# Patient Record
Sex: Female | Born: 1990 | Race: Black or African American | Hispanic: No | Marital: Single | Smoking: Never smoker
Health system: Southern US, Community
[De-identification: ages and names within clinical notes are randomized; demographics above are authoritative.]

## PROBLEM LIST (undated history)

## (undated) DIAGNOSIS — Z789 Other specified health status: Secondary | ICD-10-CM

---

## 2016-01-02 HISTORY — PX: CHOLECYSTECTOMY: SHX55

## 2021-01-01 NOTE — L&D Delivery Note (Addendum)
OB/GYN Faculty Practice Delivery Note  Elizabeth Terrell is a 31 y.o. [phone] s/p SVD of Twin A at [redacted]w[redacted]d. She was admitted for active labor and advanced dilation in setting of di/di Twin pregnancy.   ROM: 0h 39m with clear fluid GBS Status: unknown Maximum Maternal Temperature: 98.1  Labor Progress: Presented to MAU with contractions. Of note, patient was PPROMed at 20 weeks and membranes thought to be resealed after. Patient was noted to have cervical funneling in subsequent ultrasounds. In MAU on day of delivery patient was found to be 10 cm and was brought up to L&D.   Delivery Date/Time: 1018 on 6/15 Delivery: Initially a bulging bag with head at zero station noted and had patient push with contractions. Ultimately patient was AROMed (with clear fluid) and immediately after with two pushes head and body delivered. No nuchal cord present. Infant with spontaneous cry and stimulated. Cord clamped x 2 after 1-minute delay, and cut by Dr. Crissie Reese. Cord blood drawn. Cord gas sent. Placenta clamped and left in while attention placed on  Twin B.   Twin B with heart tones in 150s with accels present. Bulging bag of water noted. On [place] Twin B found to be in oblique position. Had patient push to attempt to facilitate fetal descent. Fetal HR remained in the 150s.  At that time no descent noted. AROM performed at that time and attempted to find foot for breech delivery. However unable to appreciate foot, cervix closing in with uterus contracting and cord palpated. Breech delivery attempted by Dr. Ephriam Jenkins briefly and then also Dr. Macon Large. Dr. Lovett Sox unable to grasp foot of infant and also palpated cord and therefore at that time code cesarean called and patient swiftly moved to OR. See separate operative note.   EBL: 200cc Analgesia: none  Infant: female  APGARs 7,8  weight pending  Warner Mccreedy, MD, MPH OB Fellow, Faculty Practice Center for [employer], [facility]

## 2021-01-15 ENCOUNTER — Encounter (HOSPITAL_COMMUNITY): Payer: Self-pay | Admitting: Emergency Medicine

## 2021-01-15 ENCOUNTER — Inpatient Hospital Stay (HOSPITAL_COMMUNITY)
Admission: EM | Admit: 2021-01-15 | Discharge: 2021-01-15 | Disposition: A | Discharge status: Home / Self Care | Payer: 59 | Attending: Obstetrics & Gynecology | Admitting: Obstetrics & Gynecology

## 2021-01-15 ENCOUNTER — Inpatient Hospital Stay (HOSPITAL_COMMUNITY): Payer: 59

## 2021-01-15 ENCOUNTER — Other Ambulatory Visit: Payer: Self-pay

## 2021-01-15 DIAGNOSIS — Z6790 Unspecified blood type, Rh positive: Secondary | ICD-10-CM | POA: Insufficient documentation

## 2021-01-15 DIAGNOSIS — O26851 Spotting complicating pregnancy, first trimester: Secondary | ICD-10-CM | POA: Diagnosis not present

## 2021-01-15 DIAGNOSIS — B9689 Other specified bacterial agents as the cause of diseases classified elsewhere: Secondary | ICD-10-CM

## 2021-01-15 DIAGNOSIS — O30041 Twin pregnancy, dichorionic/diamniotic, first trimester: Secondary | ICD-10-CM

## 2021-01-15 DIAGNOSIS — O209 Hemorrhage in early pregnancy, unspecified: Secondary | ICD-10-CM | POA: Diagnosis not present

## 2021-01-15 DIAGNOSIS — O30001 Twin pregnancy, unspecified number of placenta and unspecified number of amniotic sacs, first trimester: Secondary | ICD-10-CM

## 2021-01-15 DIAGNOSIS — Z3A01 Less than 8 weeks gestation of pregnancy: Secondary | ICD-10-CM | POA: Diagnosis not present

## 2021-01-15 DIAGNOSIS — N920 Excessive and frequent menstruation with regular cycle: Secondary | ICD-10-CM

## 2021-01-15 LAB — URINALYSIS, MICROSCOPIC (REFLEX): Bacteria, UA: NONE SEEN

## 2021-01-15 LAB — CBC
HCT: 36.1 % (ref 36.0–46.0)
Hemoglobin: 12 g/dL (ref 12.0–15.0)
MCH: 30.8 pg (ref 26.0–34.0)
MCHC: 33.2 g/dL (ref 30.0–36.0)
MCV: 92.8 fL (ref 80.0–100.0)
Platelets: 249 10*3/uL (ref 150–400)
RBC: 3.89 MIL/uL (ref 3.87–5.11)
RDW: 13.6 % (ref 11.5–15.5)
WBC: 8.6 10*3/uL (ref 4.0–10.5)
nRBC: 0 % (ref 0.0–0.2)

## 2021-01-15 LAB — URINALYSIS, ROUTINE W REFLEX MICROSCOPIC
Bilirubin Urine: NEGATIVE
Glucose, UA: NEGATIVE mg/dL
Ketones, ur: NEGATIVE mg/dL
Leukocytes,Ua: NEGATIVE
Nitrite: NEGATIVE
Protein, ur: NEGATIVE mg/dL
Specific Gravity, Urine: >1.03 — ABNORMAL HIGH (ref 1.005–1.030)
pH: 5.5 (ref 5.0–8.0)

## 2021-01-15 LAB — WET PREP, GENITAL
Sperm: NONE SEEN
Trich, Wet Prep: NONE SEEN
WBC, Wet Prep HPF POC: >=10 — AB (ref <10)
Yeast Wet Prep HPF POC: NONE SEEN

## 2021-01-15 LAB — ABO/RH: ABO/RH(D): A POS

## 2021-01-15 LAB — HCG, QUANTITATIVE, PREGNANCY: hCG, Beta Chain, Quant, S: 27981 m[IU]/mL — ABNORMAL HIGH (ref <5)

## 2021-01-15 MED ORDER — METRONIDAZOLE 500 MG PO TABS
500.0000 mg | ORAL_TABLET | Freq: Two times a day (BID) | ORAL | 0 refills | Status: DC
Start: 2021-01-15 — End: 2021-05-25

## 2021-01-15 NOTE — MAU Provider Note (Addendum)
History     CSN: [id number]  Arrival date and time: 01/15/21 1813   Event Date/Time   First Provider Initiated Contact with Patient 01/15/21 1948      Chief Complaint  Patient presents with   pregnant- spotting   Vaginal Bleeding   HPI Elizabeth Terrell is a 31 y.o. G2P1001 at [redacted]w[redacted]d who presents to MAU with chief complaint of vaginal spotting. This is a new problem "I've been doing this since I found out I was pregnant". Patient states she became concerned today when her spotting was noticably darker. She Facetimed with her sister and they decided she needed to present to MAU for evaluation. Patient denies pain, dysuria, abdominal tenderness, constipation, fever or recent illness.  OB History     Gravida  2   Para  1   Term  1   Preterm      AB      Living  1      SAB      IAB      Ectopic      Multiple      Live Births  1           History reviewed. No pertinent past medical history.  History reviewed. No pertinent surgical history.  History reviewed. No pertinent family history.  Social History   Tobacco Use   Smoking status: Never   Smokeless tobacco: Never  Substance Use Topics   Alcohol use: Not Currently   Drug use: Not Currently    Allergies: No Known Allergies  No medications prior to admission.    Review of Systems  Genitourinary:  Positive for vaginal bleeding.  All other systems reviewed and are negative. Physical Exam   Blood pressure (!) 147/85, pulse 98, temperature 98.5 F (36.9 C), temperature source Oral, resp. rate 18, height 5\' 2"  (1.575 m), weight (!) 156.4 kg, last menstrual period 12/03/2020, SpO2 97 %.  Physical Exam Vitals and nursing note reviewed. Exam conducted with a chaperone present.  Constitutional:      Appearance: Normal appearance.  Cardiovascular:     Rate and Rhythm: Normal rate and regular rhythm.     Pulses: Normal pulses.     Heart sounds: Normal heart sounds.  Pulmonary:     Effort: Pulmonary  effort is normal.     Breath sounds: Normal breath sounds.  Skin:    Capillary Refill: Capillary refill takes less than 2 seconds.  Neurological:     Mental Status: She is alert and oriented to person, place, and time.  Psychiatric:        Mood and Affect: Mood normal.        Behavior: Behavior normal.        Thought Content: Thought content normal.        Judgment: Judgment normal.    MAU Course  Procedures  Orders Placed This Encounter  Procedures   Wet prep, genital   US OB LESS THAN 14 WEEKS WITH OB TRANSVAGINAL   US OB Comp AddL Gest Less 14 Wks   CBC   hCG, quantitative, pregnancy   Urinalysis, Routine w reflex microscopic Urine, Clean Catch   Urinalysis, Microscopic (reflex)   ABO/Rh   Discharge patient Discharge disposition: 01-Home or Self Care; Discharge patient date: 01/15/2021   Patient Vitals for the past 24 hrs:  BP Temp Temp src Pulse Resp SpO2 Height Weight  01/15/21 2154 (!) 147/85 -- -- 98 -- -- -- --  01/15/21 1919 120/70 -- -- 95  18 97 % 5\' 2"  (1.575 m) (!) 156.4 kg  01/15/21 1839 139/87 98.5 F (36.9 C) Oral (!) 102 18 99 % -- --   Report given to K. Ardean Larsen, CNM who assumes care at this time  Mallie Snooks, [place], MSN, CNM Certified Nurse Midwife, [facility] for [employer], [place] 01/15/21 10:33 PM   Assessment and Plan   1. Dichorionic diamniotic twin pregnancy in first trimester   2. Vaginal bleeding affecting early pregnancy   3. Spotting in first trimester   -I have independently reviewed the [place] images, which reveal finding of Di-DI twin gestation. Ectopic pregnancy ruled out.   -patient has no active bleeding while in MAU; Rh+ blood type -reviewed [place] results with patient, explained that she will need extra monitoring and care due to twin gestation; patient expressed excitement about twin gestation -patient given RX for flagyl, reviewed that this could be cause for bleeding -recommended pelvic  rest for four weeks -patient to keep OB appt at the end of the month; bleeding precautions reviewed and when to return to MAU  -All questions answered

## 2021-01-15 NOTE — ED Provider Notes (Signed)
Emergency Medicine Provider OB Triage Evaluation Note  Elizabeth Terrell is a 31 y.o. female, G1P0, at [redacted]w[redacted]d gestation who presents to the emergency department with complaints of vaginal spotting over the past 2 weeks.  States she found out she was pregnant [redacted] week ago.  This is verified in epic.  No abdominal, pelvic, or back pain.  Review of  Systems  Positive: Vaginal bleeding Negative: Abdominal pain  Physical Exam  BP 139/87 (BP Location: Right Arm)    Pulse (!) 102    Temp 98.5 F (36.9 C) (Oral)    Resp 18    LMP 12/03/2020 (Exact Date)    SpO2 99%  General: Awake, no distress  HEENT: Atraumatic  Resp: Normal effort  Cardiac: Normal rate Abd: Nondistended, nontender  MSK: Moves all extremities without difficulty Neuro: Speech clear  Medical Decision Making  Pt evaluated for pregnancy concern and is stable for transfer to MAU. Pt is in agreement with plan for transfer.  6:49 PM Discussed with MAU APP, Sam, who accepts patient in transfer.  Clinical Impression  No diagnosis found.     Carlisle Cater, PA-C 01/15/21 [place], [place], DO 01/15/21 1926

## 2021-01-15 NOTE — ED Triage Notes (Signed)
Pt reports she is [redacted] weeks pregnant.  C/o spotting x 1 week.  Denies abd pain.  G2P0L1

## 2021-01-15 NOTE — MAU Note (Signed)
Elizabeth Terrell is a 31 y.o. at [redacted]w[redacted]d here in MAU reporting: Pt transferred from [facility] ED for vaginal spotting. No pain. LMP: 12/03/21 Onset of complaint: 1 week Pain score: 0/10 Vitals:   01/15/21 1839  BP: 139/87  Pulse: (!) 102  Resp: 18  Temp: 98.5 F (36.9 C)  SpO2: 99%     Lab orders placed from triage:

## 2021-01-16 LAB — GC/CHLAMYDIA PROBE AMP (~~LOC~~) NOT AT ARMC
Chlamydia: NEGATIVE
Comment: NEGATIVE
Comment: NORMAL
Neisseria Gonorrhea: NEGATIVE

## 2021-04-24 ENCOUNTER — Other Ambulatory Visit: Payer: Self-pay

## 2021-04-24 ENCOUNTER — Encounter (HOSPITAL_COMMUNITY): Payer: Self-pay | Admitting: Family Medicine

## 2021-04-24 ENCOUNTER — Inpatient Hospital Stay (HOSPITAL_BASED_OUTPATIENT_CLINIC_OR_DEPARTMENT_OTHER): Payer: Medicaid Other

## 2021-04-24 ENCOUNTER — Inpatient Hospital Stay (HOSPITAL_COMMUNITY)
Admission: AD | Admit: 2021-04-24 | Discharge: 2021-04-25 | DRG: 831 | Disposition: A | Discharge status: Home / Self Care | Payer: Medicaid Other | Attending: Obstetrics and Gynecology | Admitting: Obstetrics and Gynecology

## 2021-04-24 DIAGNOSIS — O10012 Pre-existing essential hypertension complicating pregnancy, second trimester: Secondary | ICD-10-CM | POA: Diagnosis present

## 2021-04-24 DIAGNOSIS — O10919 Unspecified pre-existing hypertension complicating pregnancy, unspecified trimester: Secondary | ICD-10-CM | POA: Diagnosis present

## 2021-04-24 DIAGNOSIS — O321XX1 Maternal care for breech presentation, fetus 1: Secondary | ICD-10-CM

## 2021-04-24 DIAGNOSIS — E876 Hypokalemia: Secondary | ICD-10-CM

## 2021-04-24 DIAGNOSIS — O42912 Preterm premature rupture of membranes, unspecified as to length of time between rupture and onset of labor, second trimester: Secondary | ICD-10-CM

## 2021-04-24 DIAGNOSIS — O99119 Other diseases of the blood and blood-forming organs and certain disorders involving the immune mechanism complicating pregnancy, unspecified trimester: Secondary | ICD-10-CM | POA: Diagnosis present

## 2021-04-24 DIAGNOSIS — Z3A20 20 weeks gestation of pregnancy: Secondary | ICD-10-CM

## 2021-04-24 DIAGNOSIS — O99210 Obesity complicating pregnancy, unspecified trimester: Secondary | ICD-10-CM | POA: Diagnosis present

## 2021-04-24 DIAGNOSIS — O99212 Obesity complicating pregnancy, second trimester: Secondary | ICD-10-CM | POA: Diagnosis present

## 2021-04-24 DIAGNOSIS — Z7982 Long term (current) use of aspirin: Secondary | ICD-10-CM

## 2021-04-24 DIAGNOSIS — O30042 Twin pregnancy, dichorionic/diamniotic, second trimester: Secondary | ICD-10-CM | POA: Diagnosis present

## 2021-04-24 DIAGNOSIS — E669 Obesity, unspecified: Secondary | ICD-10-CM | POA: Diagnosis not present

## 2021-04-24 DIAGNOSIS — O42919 Preterm premature rupture of membranes, unspecified as to length of time between rupture and onset of labor, unspecified trimester: Principal | ICD-10-CM

## 2021-04-24 DIAGNOSIS — R7401 Elevation of levels of liver transaminase levels: Secondary | ICD-10-CM | POA: Diagnosis present

## 2021-04-24 DIAGNOSIS — O3432 Maternal care for cervical incompetence, second trimester: Secondary | ICD-10-CM | POA: Diagnosis present

## 2021-04-24 DIAGNOSIS — Z6841 Body Mass Index (BMI) 40.0 and over, adult: Secondary | ICD-10-CM

## 2021-04-24 DIAGNOSIS — I10 Essential (primary) hypertension: Secondary | ICD-10-CM | POA: Diagnosis not present

## 2021-04-24 HISTORY — DX: Other specified health status: Z78.9

## 2021-04-24 LAB — WET PREP, GENITAL
Clue Cells Wet Prep HPF POC: NONE SEEN
Sperm: NONE SEEN
Trich, Wet Prep: NONE SEEN
WBC, Wet Prep HPF POC: >=10 — AB (ref <10)
Yeast Wet Prep HPF POC: NONE SEEN

## 2021-04-24 LAB — URINALYSIS, ROUTINE W REFLEX MICROSCOPIC
Glucose, UA: NEGATIVE mg/dL
Ketones, ur: >80 mg/dL — AB
Nitrite: POSITIVE — AB
Protein, ur: >300 mg/dL — AB
Specific Gravity, Urine: 1.025 (ref 1.005–1.030)
pH: 6.5 (ref 5.0–8.0)

## 2021-04-24 LAB — TYPE AND SCREEN
ABO/RH(D): A POS
Antibody Screen: NEGATIVE

## 2021-04-24 LAB — CBC WITH DIFFERENTIAL/PLATELET
Abs Immature Granulocytes: 0.04 10*3/uL (ref 0.00–0.07)
Basophils Absolute: 0 10*3/uL (ref 0.0–0.1)
Basophils Relative: 0 %
Eosinophils Absolute: 0 10*3/uL (ref 0.0–0.5)
Eosinophils Relative: 0 %
HCT: 32.7 % — ABNORMAL LOW (ref 36.0–46.0)
Hemoglobin: 11.1 g/dL — ABNORMAL LOW (ref 12.0–15.0)
Immature Granulocytes: 1 %
Lymphocytes Relative: 15 %
Lymphs Abs: 1 10*3/uL (ref 0.7–4.0)
MCH: 31.5 pg (ref 26.0–34.0)
MCHC: 33.9 g/dL (ref 30.0–36.0)
MCV: 92.9 fL (ref 80.0–100.0)
Monocytes Absolute: 0.4 10*3/uL (ref 0.1–1.0)
Monocytes Relative: 6 %
Neutro Abs: 5 10*3/uL (ref 1.7–7.7)
Neutrophils Relative %: 78 %
Platelets: 166 10*3/uL (ref 150–400)
RBC: 3.52 MIL/uL — ABNORMAL LOW (ref 3.87–5.11)
RDW: 13.8 % (ref 11.5–15.5)
WBC: 6.5 10*3/uL (ref 4.0–10.5)
nRBC: 0 % (ref 0.0–0.2)

## 2021-04-24 LAB — COMPREHENSIVE METABOLIC PANEL
ALT: 45 U/L — ABNORMAL HIGH (ref 0–44)
AST: 72 U/L — ABNORMAL HIGH (ref 15–41)
Albumin: 2.4 g/dL — ABNORMAL LOW (ref 3.5–5.0)
Alkaline Phosphatase: 41 U/L (ref 38–126)
Anion gap: 11 (ref 5–15)
BUN: <5 mg/dL — ABNORMAL LOW (ref 6–20)
CO2: 23 mmol/L (ref 22–32)
Calcium: 8.3 mg/dL — ABNORMAL LOW (ref 8.9–10.3)
Chloride: 101 mmol/L (ref 98–111)
Creatinine, Ser: 0.46 mg/dL (ref 0.44–1.00)
GFR, Estimated: >60 mL/min (ref >60)
Glucose, Bld: 100 mg/dL — ABNORMAL HIGH (ref 70–99)
Potassium: 2.9 mmol/L — ABNORMAL LOW (ref 3.5–5.1)
Sodium: 135 mmol/L (ref 135–145)
Total Bilirubin: 1.3 mg/dL — ABNORMAL HIGH (ref 0.3–1.2)
Total Protein: 6.7 g/dL (ref 6.5–8.1)

## 2021-04-24 LAB — URINALYSIS, MICROSCOPIC (REFLEX)

## 2021-04-24 LAB — AMNISURE RUPTURE OF MEMBRANE (ROM) NOT AT ARMC: Amnisure ROM: POSITIVE

## 2021-04-24 LAB — POCT FERN TEST: POCT Fern Test: POSITIVE

## 2021-04-24 MED ORDER — SODIUM CHLORIDE 0.9 % IV SOLN
2.0000 g | Freq: Four times a day (QID) | INTRAVENOUS | Status: DC
  Administered 2021-04-24: 2 g via INTRAVENOUS
  Filled 2021-04-24: qty 2000

## 2021-04-24 MED ORDER — INDOMETHACIN 25 MG PO CAPS
50.0000 mg | ORAL_CAPSULE | Freq: Once | ORAL | Status: AC
  Administered 2021-04-24: 50 mg via ORAL
  Filled 2021-04-24: qty 2

## 2021-04-24 MED ORDER — LACTATED RINGERS IV SOLN: INTRAVENOUS | Status: DC

## 2021-04-24 MED ORDER — ONDANSETRON 4 MG PO TBDP
8.0000 mg | ORAL_TABLET | Freq: Once | ORAL | Status: AC
  Administered 2021-04-24: 8 mg via ORAL
  Filled 2021-04-24: qty 2

## 2021-04-24 MED ORDER — DOCUSATE SODIUM 100 MG PO CAPS
100.0000 mg | ORAL_CAPSULE | Freq: Every day | ORAL | Status: DC
Start: 2021-04-24 — End: 2021-04-24

## 2021-04-24 MED ORDER — AZITHROMYCIN 250 MG PO TABS: 1000.0000 mg | ORAL_TABLET | Freq: Once | ORAL | Status: DC

## 2021-04-24 MED ORDER — ACETAMINOPHEN 325 MG PO TABS: 650.0000 mg | ORAL_TABLET | ORAL | Status: DC | PRN

## 2021-04-24 MED ORDER — SODIUM CHLORIDE 0.9 % IV SOLN: 500.0000 mg | INTRAVENOUS | Status: DC

## 2021-04-24 MED ORDER — NIFEDIPINE ER OSMOTIC RELEASE 30 MG PO TB24
30.0000 mg | ORAL_TABLET | Freq: Every day | ORAL | Status: DC
  Administered 2021-04-25: 30 mg via ORAL
  Filled 2021-04-24: qty 1

## 2021-04-24 MED ORDER — INDOMETHACIN 25 MG PO CAPS: 50.0000 mg | ORAL_CAPSULE | Freq: Once | ORAL | Status: DC

## 2021-04-24 MED ORDER — CALCIUM CARBONATE ANTACID 500 MG PO CHEW: 2.0000 | CHEWABLE_TABLET | ORAL | Status: DC | PRN

## 2021-04-24 MED ORDER — AMOXICILLIN 500 MG PO CAPS: 500.0000 mg | ORAL_CAPSULE | Freq: Three times a day (TID) | ORAL | Status: DC

## 2021-04-24 MED ORDER — POTASSIUM CHLORIDE CRYS ER 20 MEQ PO TBCR
20.0000 meq | EXTENDED_RELEASE_TABLET | Freq: Every day | ORAL | Status: DC
  Administered 2021-04-24: 20 meq via ORAL
  Filled 2021-04-24: qty 1

## 2021-04-24 MED ORDER — AZITHROMYCIN 250 MG PO TABS
1000.0000 mg | ORAL_TABLET | Freq: Once | ORAL | Status: AC
  Administered 2021-04-24: 1000 mg via ORAL
  Filled 2021-04-24: qty 4

## 2021-04-24 MED ORDER — PRENATAL MULTIVITAMIN CH
1.0000 | ORAL_TABLET | Freq: Every day | ORAL | Status: DC
  Administered 2021-04-25: 1 via ORAL
  Filled 2021-04-24: qty 1

## 2021-04-24 MED ORDER — ZOLPIDEM TARTRATE 5 MG PO TABS: 5.0000 mg | ORAL_TABLET | Freq: Every evening | ORAL | Status: DC | PRN

## 2021-04-24 NOTE — Progress Notes (Signed)
FACULTY PRACTICE ANTEPARTUM COMPREHENSIVE PROGRESS NOTE ? ?Elizabeth Terrell is a 31 y.o. G2P1001 at [redacted]w[redacted]d who is admitted for PPROM in s/o Didi twins.  Estimated Date of Delivery: 09/09/21 ?Fetal presentation is  A is breech, B is cephalic . ? ?Length of Stay:  0 Days. Admitted 04/24/2021 ? ?Subjective: ?Patient reports good fetal movement.  She reports no uterine contractions, no bleeding. She has ongoing vaginal leakage of fluid.  ? ?Vitals:  Blood pressure 137/90, pulse (!) 114, temperature 98.1 ?F (36.7 ?C), temperature source Oral, resp. rate 18, height 5\' 2"  (1.575 m), weight (!) 143.8 kg, last menstrual period 12/03/2020, SpO2 99 %. ?Physical Examination: ?CONSTITUTIONAL: Well-developed, well-nourished female in no acute distress.  ?NEUROLOGIC: Alert and oriented to person, place, and time. No cranial nerve deficit noted. ?PSYCHIATRIC: Normal mood and affect. Normal behavior. Normal judgment and thought content. ?CARDIOVASCULAR: Normal heart rate noted ?RESPIRATORY: Effort normal, no problems with respiration noted ?MUSCULOSKELETAL: Normal range of motion. No edema and no tenderness. 2+ distal pulses. ?ABDOMEN: Soft, nontender, nondistended, gravid. ?CERVIX: Dilation: 2.5 ?Exam by:: [email or web address], NP ? ?Uterine activity: no contractions ? ?Results for orders placed or performed during the hospital encounter of 04/24/21 (from the past 48 hour(s))  ?Amnisure rupture of membrane (rom)not at [facility]     Status: None  ? Collection Time: 04/24/21 11:15 AM  ?Result Value Ref Range  ? Amnisure ROM POSITIVE   ?  Comment: Performed at [facility], [address], [place], [place]  ?Wet prep, genital     Status: Abnormal  ? Collection Time: 04/24/21 11:24 AM  ? Specimen: Cervix  ?Result Value Ref Range  ? Yeast Wet Prep HPF POC NONE SEEN NONE SEEN  ? Trich, Wet Prep NONE SEEN NONE SEEN  ? Clue Cells Wet Prep HPF POC NONE SEEN NONE SEEN  ? WBC, Wet Prep HPF POC >=10 (A) <10  ? Sperm NONE SEEN   ?  Comment: Performed  at [facility], [address], [place], [place]  ?Urinalysis, Routine w reflex microscopic Urine, Clean Catch     Status: Abnormal  ? Collection Time: 04/24/21 11:37 AM  ?Result Value Ref Range  ? Color, Urine AMBER (A) YELLOW  ?  Comment: BIOCHEMICALS MAY BE AFFECTED BY COLOR  ? APPearance CLOUDY (A) CLEAR  ? Specific Gravity, Urine 1.025 1.005 - 1.030  ? pH 6.5 5.0 - 8.0  ? Glucose, UA NEGATIVE NEGATIVE mg/dL  ? Hgb urine dipstick TRACE (A) NEGATIVE  ? Bilirubin Urine MODERATE (A) NEGATIVE  ? Ketones, ur >80 (A) NEGATIVE mg/dL  ? Protein, ur >300 (A) NEGATIVE mg/dL  ? Nitrite POSITIVE (A) NEGATIVE  ? Leukocytes,Ua TRACE (A) NEGATIVE  ?  Comment: Performed at [facility], [address], [place], [place]  ?Urinalysis, Microscopic (reflex)     Status: Abnormal  ? Collection Time: 04/24/21 11:37 AM  ?Result Value Ref Range  ? RBC / HPF 6-10 0 - 5 RBC/hpf  ? WBC, UA 6-10 0 - 5 WBC/hpf  ? Bacteria, UA FEW (A) NONE SEEN  ? Squamous Epithelial / LPF 6-10 0 - 5  ? Mucus PRESENT   ?  Comment: Performed at [facility], [address], [place], [place]  ?Type and screen [facility]     Status: None  ? Collection Time: 04/24/21 11:48 AM  ?Result Value Ref Range  ? ABO/RH(D) A POS   ? Antibody Screen  NEG   ? Sample Expiration    ?  04/27/2021,2359 ?Performed at [facility], [address] [place], [place] [address] ?  ?CBC with Differential/Platelet     Status: Abnormal  ? Collection Time: 04/24/21 11:51 AM  ?Result Value Ref Range  ? WBC 6.5 4.0 - 10.5 K/uL  ? RBC 3.52 (L) 3.87 - 5.11 MIL/uL  ? Hemoglobin 11.1 (L) 12.0 - 15.0 g/dL  ? HCT 32.7 (L) 36.0 - 46.0 %  ? MCV 92.9 80.0 - 100.0 fL  ? MCH 31.5 26.0 - 34.0 pg  ? MCHC 33.9 30.0 - 36.0 g/dL  ? RDW 13.8 11.5 - 15.5 %  ? Platelets 166 150 - 400 K/uL  ? nRBC 0.0 0.0 - 0.2 %  ? Neutrophils Relative % 78 %  ? Neutro Abs 5.0 1.7 - 7.7 K/uL  ? Lymphocytes Relative 15 %  ? Lymphs Abs 1.0 0.7 - 4.0 K/uL  ?  Monocytes Relative 6 %  ? Monocytes Absolute 0.4 0.1 - 1.0 K/uL  ? Eosinophils Relative 0 %  ? Eosinophils Absolute 0.0 0.0 - 0.5 K/uL  ? Basophils Relative 0 %  ? Basophils Absolute 0.0 0.0 - 0.1 K/uL  ? Immature Granulocytes 1 %  ? Abs Immature Granulocytes 0.04 0.00 - 0.07 K/uL  ?  Comment: Performed at [facility], [address] [place], [place] [address]  ?Comprehensive metabolic panel     Status: Abnormal  ? Collection Time: 04/24/21 11:51 AM  ?Result Value Ref Range  ? Sodium 135 135 - 145 mmol/L  ? Potassium 2.9 (L) 3.5 - 5.1 mmol/L  ? Chloride 101 98 - 111 mmol/L  ? CO2 23 22 - 32 mmol/L  ? Glucose, Bld 100 (H) 70 - 99 mg/dL  ?  Comment: Glucose reference range applies only to samples taken after fasting for at least 8 hours.  ? BUN <5 (L) 6 - 20 mg/dL  ? Creatinine, Ser 0.46 0.44 - 1.00 mg/dL  ? Calcium 8.3 (L) 8.9 - 10.3 mg/dL  ? Total Protein 6.7 6.5 - 8.1 g/dL  ? Albumin 2.4 (L) 3.5 - 5.0 g/dL  ? AST 72 (H) 15 - 41 U/L  ? ALT 45 (H) 0 - 44 U/L  ? Alkaline Phosphatase 41 38 - 126 U/L  ? Total Bilirubin 1.3 (H) 0.3 - 1.2 mg/dL  ? GFR, Estimated >60 >60 mL/min  ?  Comment: (NOTE) ?Calculated using the CKD-EPI Creatinine Equation (2021) ?  ? Anion gap 11 5 - 15  ?  Comment: Performed at [facility], [address] [place], [place] [address]  ?Fern Test     Status: None  ? Collection Time: 04/24/21  1:42 PM  ?Result Value Ref Range  ? POCT Fern Test Positive = ruptured amniotic membanes   ? ? ?No results found. ? ?Current scheduled medications ? [START ON 04/25/2021] NIFEdipine  30 mg Oral Daily  ? potassium chloride  20 mEq Oral Daily  ? prenatal multivitamin  1 tablet Oral Q1200  ? ? ?I have reviewed the patient's current medications. ? ?ASSESSMENT: ?Principal Problem: ?  Preterm premature rupture of membranes (PPROM) with unknown onset of labor ?Active Problems: ?  Chronic hypertension affecting pregnancy ?  BMI 50.0-59.9, adult (HCC) ?  Obesity in pregnancy ?  Transaminitis ? ? ?PLAN: ?-  Pt had requested transfer to [facility] in hopes they would manage her care differently. I honored patient request and reviewed our care plan  with their physician, Dr. Prudencio Pair (sp?). She reported they would treat very similarly with the only difference being that sometimes they would offer latency IV ABX and home PO antibiotics once discharged.  While they would be willing to take care of the patient, they agreed that transfer by ambulance is not indicated at this time however if the patient was discharged they would be happy to see her and offer their second opinion.  ?- She and I reviewed that the best option right now for her desires is expectant management. If she remains pregnant, we would create a plan for when she would come in to delivery and receive BMZ and other measures to aid in outcomes. We reviewed that with PPROM, typical time to delivery is one week, but there are many instances where women remain pregnant until viability. We discussed it will be important to also talk to the NICU to be prepared for potential risks of low AFI (her AFI is normal for both), and risks of prematurity.  ?- We discussed that the medications are not effective for the length of the pregnancy and hence why we advise doing them closer to viability. We discussed latency antibiotics prior to 23 weeks has not shown to be effective and is thus not part of the recommendations although some providers may offer them (as noted above).  ?- She will need a GBS swab tomorrow ?- All questions answered and the patient would like to stay overnight.  ? ?Continue routine antenatal care. ? ? ?Milas Hock, MD, FACOG ?Obstetrician Heritage manager, Faculty Practice ?Center for [employer], [facility] ? ?

## 2021-04-24 NOTE — MAU Note (Signed)
Elizabeth Terrell is a 31 y.o. at [redacted]w[redacted]d here in MAU reporting: LOF yellow fluid that began Thursday and period like cramping.  Denies VB. ? ?Onset of complaint: cramping 2 weeks /LOF Thursday ?Pain score: 9/10 with ambulation ?Vitals:  ? 04/24/21 1032  ?BP: 110/62  ?Pulse: (!) 127  ?Resp: 20  ?Temp: 98.7 ?F (37.1 ?C)  ?SpO2: 96%  ?   ? ?Lab orders placed from triage:    ?

## 2021-04-24 NOTE — MAU Provider Note (Addendum)
?History  ?  ? ?CSN: [id number] ? ?Arrival date and time: 04/24/21 0956 ? ? Event Date/Time  ? First Provider Initiated Contact with Patient 04/24/21 1059   ?  ? ?Chief Complaint  ?Patient presents with  ? Rupture of Membranes  ? ?HPI ? ?Ms.Marland KitchenAilah Terrell is a 31 y.o. female G87P1001 @ [redacted]w[redacted]d with Di/Di twins here In MAU with complaints of leaking of fluid. She has been leaking fluid since Thursday. The fluid is yellow in color. She reports fluid is leaking down her leg. She has no itching or burning. She reports lower abdominal cramping that comes and goes. The pain feels like period cramps. She has not taken anything for the pain. She has no bleeding.  ? ?She started her care at [facility], on 4/17 she was told her cervix was 1 cm dilated, she was offered cerclage vs progesterone. She chose progesterone which she has been placing nightly. She recently transferred her care to [facility] and is scheduled for her first appointment.  ? ?Hx of fatty liver disease (negative hepatitis), no history of diabetes.  ? ?OB History   ? ? Gravida  ?2  ? Para  ?1  ? Term  ?1  ? Preterm  ?   ? AB  ?   ? Living  ?1  ?  ? ? SAB  ?   ? IAB  ?   ? Ectopic  ?   ? Multiple  ?   ? Live Births  ?1  ?   ?  ?  ? ? ?Past Medical History:  ?Diagnosis Date  ? Medical history non-contributory   ? ? ?Past Surgical History:  ?Procedure Laterality Date  ? CHOLECYSTECTOMY  2018  ? ? ?Family History  ?Problem Relation Age of Onset  ? Glaucoma Mother   ? Heart disease Father   ? Hypertension Father   ? ? ?Social History  ? ?Tobacco Use  ? Smoking status: Never  ? Smokeless tobacco: Never  ?Vaping Use  ? Vaping Use: Never used  ?Substance Use Topics  ? Alcohol use: Not Currently  ?  Comment: Occas  ? Drug use: Not Currently  ? ? ?Allergies: No Known Allergies ? ?Medications Prior to Admission  ?Medication Sig Dispense Refill Last Dose  ? aspirin 81 MG chewable tablet Chew 81 mg by mouth daily.   04/23/2021  ? NIFEdipine (PROCARDIA-XL/NIFEDICAL-XL) 30 MG 24  hr tablet Take 30 mg by mouth daily.   04/23/2021  ? Prenatal Vit-Fe Fumarate-FA (MULTIVITAMIN-PRENATAL) 27-0.8 MG TABS tablet Take 1 tablet by mouth daily at 12 noon.   04/23/2021  ? progesterone (PROMETRIUM) 200 MG capsule Take 200 mg by mouth daily.   04/23/2021  ? metroNIDAZOLE (FLAGYL) 500 MG tablet Take 1 tablet (500 mg total) by mouth 2 (two) times daily. 14 tablet 0   ? ?Results for orders placed or performed during the hospital encounter of 04/24/21 (from the past 48 hour(s))  ?Amnisure rupture of membrane (rom)not at [facility]     Status: None  ? Collection Time: 04/24/21 11:15 AM  ?Result Value Ref Range  ? Amnisure ROM POSITIVE   ?  Comment: Performed at [facility], [address] [place], [place] [address]  ?Wet prep, genital     Status: Abnormal  ? Collection Time: 04/24/21 11:24 AM  ? Specimen: Cervix  ?Result Value Ref Range  ? Yeast Wet Prep HPF POC NONE SEEN NONE SEEN  ? Trich, Wet Prep NONE SEEN NONE SEEN  ?  Clue Cells Wet Prep HPF POC NONE SEEN NONE SEEN  ? WBC, Wet Prep HPF POC >=10 (A) <10  ? Sperm NONE SEEN   ?  Comment: Performed at [facility], [address] [place], [place] [address]  ?Urinalysis, Routine w reflex microscopic Urine, Clean Catch     Status: Abnormal  ? Collection Time: 04/24/21 11:37 AM  ?Result Value Ref Range  ? Color, Urine AMBER (A) YELLOW  ?  Comment: BIOCHEMICALS MAY BE AFFECTED BY COLOR  ? APPearance CLOUDY (A) CLEAR  ? Specific Gravity, Urine 1.025 1.005 - 1.030  ? pH 6.5 5.0 - 8.0  ? Glucose, UA NEGATIVE NEGATIVE mg/dL  ? Hgb urine dipstick TRACE (A) NEGATIVE  ? Bilirubin Urine MODERATE (A) NEGATIVE  ? Ketones, ur >80 (A) NEGATIVE mg/dL  ? Protein, ur >300 (A) NEGATIVE mg/dL  ? Nitrite POSITIVE (A) NEGATIVE  ? Leukocytes,Ua TRACE (A) NEGATIVE  ?  Comment: Performed at [facility], [address] [place], [place] [address]  ?Urinalysis, Microscopic (reflex)     Status: Abnormal  ? Collection Time: 04/24/21 11:37 AM  ?Result Value Ref Range  ? RBC /  HPF 6-10 0 - 5 RBC/hpf  ? WBC, UA 6-10 0 - 5 WBC/hpf  ? Bacteria, UA FEW (A) NONE SEEN  ? Squamous Epithelial / LPF 6-10 0 - 5  ? Mucus PRESENT   ?  Comment: Performed at [facility], [address], [place], [place] [address]  ?CBC with Differential/Platelet     Status: Abnormal  ? Collection Time: 04/24/21 11:51 AM  ?Result Value Ref Range  ? WBC 6.5 4.0 - 10.5 K/uL  ? RBC 3.52 (L) 3.87 - 5.11 MIL/uL  ? Hemoglobin 11.1 (L) 12.0 - 15.0 g/dL  ? HCT 32.7 (L) 36.0 - 46.0 %  ? MCV 92.9 80.0 - 100.0 fL  ? MCH 31.5 26.0 - 34.0 pg  ? MCHC 33.9 30.0 - 36.0 g/dL  ? RDW 13.8 11.5 - 15.5 %  ? Platelets 166 150 - 400 K/uL  ? nRBC 0.0 0.0 - 0.2 %  ? Neutrophils Relative % 78 %  ? Neutro Abs 5.0 1.7 - 7.7 K/uL  ? Lymphocytes Relative 15 %  ? Lymphs Abs 1.0 0.7 - 4.0 K/uL  ? Monocytes Relative 6 %  ? Monocytes Absolute 0.4 0.1 - 1.0 K/uL  ? Eosinophils Relative 0 %  ? Eosinophils Absolute 0.0 0.0 - 0.5 K/uL  ? Basophils Relative 0 %  ? Basophils Absolute 0.0 0.0 - 0.1 K/uL  ? Immature Granulocytes 1 %  ? Abs Immature Granulocytes 0.04 0.00 - 0.07 K/uL  ?  Comment: Performed at [facility], [address], [place], [place] [address]  ?Comprehensive metabolic panel     Status: Abnormal  ? Collection Time: 04/24/21 11:51 AM  ?Result Value Ref Range  ? Sodium 135 135 - 145 mmol/L  ? Potassium 2.9 (L) 3.5 - 5.1 mmol/L  ? Chloride 101 98 - 111 mmol/L  ? CO2 23 22 - 32 mmol/L  ? Glucose, Bld 100 (H) 70 - 99 mg/dL  ?  Comment: Glucose reference range applies only to samples taken after fasting for at least 8 hours.  ? BUN <5 (L) 6 - 20 mg/dL  ? Creatinine, Ser 0.46 0.44 - 1.00 mg/dL  ? Calcium 8.3 (L) 8.9 - 10.3 mg/dL  ? Total Protein 6.7 6.5 - 8.1 g/dL  ? Albumin 2.4 (L) 3.5 - 5.0  g/dL  ? AST 72 (H) 15 - 41 U/L  ? ALT 45 (H) 0 - 44 U/L  ? Alkaline Phosphatase 41 38 - 126 U/L  ? Total Bilirubin 1.3 (H) 0.3 - 1.2 mg/dL  ? GFR, Estimated >60 >60 mL/min  ?  Comment: (NOTE) ?Calculated using the CKD-EPI Creatinine Equation  (2021) ?  ? Anion gap 11 5 - 15  ?  Comment: Performed at [facility], [address] [place], [place] [address]  ?  ?Review of Systems  ?Constitutional:  Negative for fever.  ?Gastrointestinal:  Positive for abdominal pain.  ?Genitourinary:  Positive for vaginal discharge. Negative for vaginal bleeding.  ?Physical Exam  ? ?Blood pressure 130/76, pulse (!) 106, temperature 98.5 ?F (36.9 ?C), temperature source Oral, resp. rate 18, weight (!) 144 kg, last menstrual period 12/03/2020, SpO2 97 %. ? ?Physical Exam ?Vitals and nursing note reviewed.  ?Constitutional:   ?   General: She is not in acute distress. ?   Appearance: She is obese. She is not ill-appearing or diaphoretic.  ?HENT:  ?   Head: Normocephalic.  ?Eyes:  ?   Pupils: Pupils are equal, round, and reactive to light.  ?Genitourinary: ?   Comments: Vagina - moderate to large amount of yellow/green particulate fluid.  ?Cervix -fluid activiely leaking from cervix. 2 white, 2 mm white patches noted on cervix.  ?Bimanual exam: 2.5 cm, 50 %, -3 ?GC/Chlam, wet prep done ?Chaperone present for exam.   ?Exam by Venia CarbonJennifer Tattiana Fakhouri, NP  ?Musculoskeletal:  ?   Cervical back: Normal range of motion.  ?Skin: ?   General: Skin is warm.  ?Neurological:  ?   Mental Status: She is oriented to person, place, and time.  ? ?MAU Course  ?Procedures ?None ? ?MDM ? ?+ fetal heart tones ?Amnisure positive, fern positive ?US shows normal fluid levels ?GC/ wet prep collected  ?CBC and CMP ?Discussed patient with Dr. Adrian BlackwaterStinson who recommends antibiotics to be started now.  ?1 dose of indocin given for pain.  ?Spoke to Dr. Vergie LivingPickens through RN in the OR; observation recommended at this time.  ?Indocin and amoxicillin vomited 30 minutes after given.  ?Zofran 8 mg ODT. Will attempt following Zofran.  ? ?Assessment and Plan  ? ?A: ? ?1. Preterm premature rupture of membranes (PPROM) with unknown onset of labor   ?2. [redacted] weeks gestation of pregnancy   ?3. Dichorionic diamniotic twin  pregnancy in second trimester   ?4. Chronic hypokalemia   ?  ? ?P: ? ?Admit to Ante ?Neonatology consult ?Reviewed the plan of care in detail with the patient.  ?Kdur ? ? ? ?Duane Lopeasch, Soliyana Mcchristian I, NP ?04/24/2021 ?1:5

## 2021-04-24 NOTE — H&P (Signed)
Obstetrics Admission History & Physical  ?04/24/2021 - 6:29 PM ?Primary OBGYN: [facility] ? ?Chief Complaint: PPROM today  ?History of Present Illness  ?31 y.o. G2P1001 @ [redacted]w[redacted]d (LMP=5wk u/s), with the above CC. Pregnancy complicated by: di-di twins, cHTN, BMI 50s, h/o pre-eclampsia, elevated ast/alt ? ?[name] states that she's had vaginal discharge for the past week and cramping. Evaluation in OB triage showed PPROM on fern and on speculum exam, which showed "moderate to large amount of yellow/green particulate fluid" with actively LOF from the cervix and the cervix was 2-3/50/-3 on digital check.  ? ?Patient admitted for observation.  ? ?On 4/17, patient had anatomy u/s that showed "dynamic short cx" at 10.49mm with U shape, and pt noted to be asymptomatic. She had a repeat spec exam that same day that was stable at 1cm and no visible membranes. It appears that cerclage vs vag progesterone offered and she elected for the latter with repeat u/s. This was done on 4/21, which showed u shaped funnel similar to prior scan and approx 15.81mm in length. It appears that they again d/w her re: both options and she elected to continue with vaginal progesterone, with plan to repeat u/s in a week. ? ? ?Review of Systems: as noted in the History of Present Illness. ? ?Patient Active Problem List  ? Diagnosis Date Noted  ? Preterm premature rupture of membranes (PPROM) with unknown onset of labor 04/24/2021  ? Chronic hypertension affecting pregnancy 04/24/2021  ? BMI 50.0-59.9, adult (HCC) 04/24/2021  ? Obesity in pregnancy 04/24/2021  ? Transaminitis 04/24/2021  ? Twin gestation in first trimester 01/15/2021  ? Bacterial vaginosis 01/15/2021  ? Spotting 01/15/2021  ?  ? ?PMHx:  ?Past Medical History:  ?Diagnosis Date  ? Medical history non-contributory   ? ?PSHx:  ?Past Surgical History:  ?Procedure Laterality Date  ? CHOLECYSTECTOMY  2018  ? ?Medications:  ?Medications Prior to Admission  ?Medication Sig Dispense  Refill Last Dose  ? aspirin 81 MG chewable tablet Chew 81 mg by mouth daily.   04/23/2021  ? NIFEdipine (PROCARDIA-XL/NIFEDICAL-XL) 30 MG 24 hr tablet Take 30 mg by mouth daily.   04/23/2021  ? Prenatal Vit-Fe Fumarate-FA (MULTIVITAMIN-PRENATAL) 27-0.8 MG TABS tablet Take 1 tablet by mouth daily at 12 noon.   04/23/2021  ? progesterone (PROMETRIUM) 200 MG capsule Take 200 mg by mouth daily.   04/23/2021  ? metroNIDAZOLE (FLAGYL) 500 MG tablet Take 1 tablet (500 mg total) by mouth 2 (two) times daily. 14 tablet 0   ? ? ? ?Allergies: has No Known Allergies. ?OBHx:  ?OB History  ?Gravida Para Term Preterm AB Living  ?2 1 1     1   ?SAB IAB Ectopic Multiple Live Births  ?        1  ?  ?# Outcome Date GA Lbr Len/2nd Weight Sex Delivery Anes PTL Lv  ?2 Current           ?1 Term 2018     Vag-Spont   LIV  ?      ?FHx:  ?Family History  ?Problem Relation Age of Onset  ? Glaucoma Mother   ? Heart disease Father   ? Hypertension Father   ? ?Soc Hx:  ?Social History  ? ?Socioeconomic History  ? Marital status: Single  ?  Spouse name: Not on file  ? Number of children: Not on file  ? Years of education: Not on file  ? Highest education level: Not on file  ?  Occupational History  ? Not on file  ?Tobacco Use  ? Smoking status: Never  ? Smokeless tobacco: Never  ?Vaping Use  ? Vaping Use: Never used  ?Substance and Sexual Activity  ? Alcohol use: Not Currently  ?  Comment: Occas  ? Drug use: Not Currently  ? Sexual activity: Yes  ?Other Topics Concern  ? Not on file  ?Social History Narrative  ? Not on file  ? ?Social Determinants of Health  ? ?Financial Resource Strain: Not on file  ?Food Insecurity: Not on file  ?Transportation Needs: Not on file  ?Physical Activity: Not on file  ?Stress: Not on file  ?Social Connections: Not on file  ?Intimate Partner Violence: Not on file  ? ? ?Objective  ? ? Current Vital Signs 24h Vital Sign Ranges  ?T 97.8 ?F (36.6 ?C) Temp  Avg: 98.3 ?F (36.8 ?C)  Min: 97.8 ?F (36.6 ?C)  Max: 98.7 ?F (37.1 ?C)   ?BP 137/83 BP  Min: 107/65  Max: 152/77  ?HR (!) 103 ? Pulse  Avg: 113.7  Min: 103  Max: 127  ?RR 17 Resp  Avg: 18.3  Min: 17  Max: 20  ?SaO2 98 % Room Air SpO2  Avg: 96.6 %  Min: 96 %  Max: 98 %  ?    ? 24 Hour I/O Current Shift I/O  ?Time ?Ins ?Outs No intake/output data recorded. 04/24 0701 - 04/24 1900 ?In: 557.9 [P.O.:30; I.V.:422.9] ?Out: -   ? ?Toco: quiet ? ?General: Well nourished, well developed female in no acute distress.  ?Skin:  Warm and dry.  ?Respiratory:   Normal respiratory effort ?Abdomen: obese, nttp ?Neuro/Psych:  Normal mood and affect.  ? ? ?Labs  ?As per HPI ?Pending: GC/CT ?Wet prep: negative ?Recent Labs  ?Lab 04/24/21 ?1151  ?WBC 6.5  ?HGB 11.1*  ?HCT 32.7*  ?PLT 166  ? ? ?Recent Labs  ?Lab 04/24/21 ?1151  ?NA 135  ?K 2.9*  ?CL 101  ?CO2 23  ?BUN <5*  ?CREATININE 0.46  ?CALCIUM 8.3*  ?PROT 6.7  ?BILITOT 1.3*  ?ALKPHOS 41  ?ALT 45*  ?AST 72*  ?GLUCOSE 100*  ? ? ?Radiology ?4/24 prelim ?A breech, normal AFI and FHR ?B cephalic, normal AFI and FHR ? ?4/17 ?A: 251gm, 15% ?B: 256gm, 19% ? ?Assessment & Plan  ? 31 y.o. G2P1001 @ [redacted]w[redacted]d with PPROM diagnosed today; pt stable ?Long d/w patient re: plan of care. When patient was initially admitted from [facility] triage, the admitting provider placed her on indocin and latency abx; she as never given antenatal steroids. I discontinued the tocolytic and abx.  ? ?I told her that given how pre-viable she is abx, steroids and tocolysis is not indicated, and she is upset at this. I told her that, at this point, our focus is on maternal well being b/c of how pre-viable the babies are. I told her that I recommend observation overnight and if stable, then okay to discharge to home tomorrow and weekly checks and admission at 23-24wks for steroids, abx, and possibly Mg. She feels that I don't care about the well being of her babies and that [facility] offered her a cerclage which she states that she would like. I told her that when they saw her last week her water was  not broken, and I told her that if it was, they would not have offered her a cerclage. She states that her water was broken last week b/c she has been having discharge since  then, but I, again, told her that their evaluation last week did not indicated ROM, and that, most likely, they would not have offered her a cerclage if they thought she had ROM.  ?I told her that I do and it is a difficult situation but b/c of their extreme pre-maturity the above interventions at this time are not indicated. I told her that observing her overnight is a pre-cautionary measure that I do for PPROM mid 2nd trimester and if she wanted to NICU tonight and MFM in the morning then I can arrange that. I also said that if she desired the opinion of her primary OBs at [facility] then she is stable for d/c tonight to have them evaluate her tonight.  ?Patient stays that she will stay for now but declines NICU consult overnight or MFM consult tomorrow morning b/c she said she might leave ? ?Leave on toco overnight. Bid FHTs. SCDs, OOB ad lib. Regular diet. SLIV ? ?Elizabeth Terrell, Jr. MD ?Attending ?[facility](Faculty Practice) ? ?

## 2021-04-25 ENCOUNTER — Inpatient Hospital Stay (HOSPITAL_BASED_OUTPATIENT_CLINIC_OR_DEPARTMENT_OTHER): Payer: Medicaid Other

## 2021-04-25 DIAGNOSIS — O30042 Twin pregnancy, dichorionic/diamniotic, second trimester: Secondary | ICD-10-CM

## 2021-04-25 DIAGNOSIS — O42912 Preterm premature rupture of membranes, unspecified as to length of time between rupture and onset of labor, second trimester: Secondary | ICD-10-CM | POA: Diagnosis not present

## 2021-04-25 DIAGNOSIS — O99119 Other diseases of the blood and blood-forming organs and certain disorders involving the immune mechanism complicating pregnancy, unspecified trimester: Secondary | ICD-10-CM | POA: Diagnosis present

## 2021-04-25 DIAGNOSIS — O10912 Unspecified pre-existing hypertension complicating pregnancy, second trimester: Secondary | ICD-10-CM

## 2021-04-25 DIAGNOSIS — O321XX1 Maternal care for breech presentation, fetus 1: Secondary | ICD-10-CM

## 2021-04-25 DIAGNOSIS — D696 Thrombocytopenia, unspecified: Secondary | ICD-10-CM | POA: Diagnosis present

## 2021-04-25 DIAGNOSIS — O99212 Obesity complicating pregnancy, second trimester: Secondary | ICD-10-CM

## 2021-04-25 DIAGNOSIS — E669 Obesity, unspecified: Secondary | ICD-10-CM

## 2021-04-25 DIAGNOSIS — E876 Hypokalemia: Secondary | ICD-10-CM | POA: Diagnosis present

## 2021-04-25 DIAGNOSIS — Z3A20 20 weeks gestation of pregnancy: Secondary | ICD-10-CM

## 2021-04-25 DIAGNOSIS — O3432 Maternal care for cervical incompetence, second trimester: Secondary | ICD-10-CM

## 2021-04-25 DIAGNOSIS — O42919 Preterm premature rupture of membranes, unspecified as to length of time between rupture and onset of labor, unspecified trimester: Secondary | ICD-10-CM | POA: Diagnosis not present

## 2021-04-25 DIAGNOSIS — O10012 Pre-existing essential hypertension complicating pregnancy, second trimester: Secondary | ICD-10-CM

## 2021-04-25 DIAGNOSIS — I10 Essential (primary) hypertension: Secondary | ICD-10-CM

## 2021-04-25 LAB — COMPREHENSIVE METABOLIC PANEL
ALT: 39 U/L (ref 0–44)
AST: 56 U/L — ABNORMAL HIGH (ref 15–41)
Albumin: 2.1 g/dL — ABNORMAL LOW (ref 3.5–5.0)
Alkaline Phosphatase: 34 U/L — ABNORMAL LOW (ref 38–126)
Anion gap: 8 (ref 5–15)
BUN: <5 mg/dL — ABNORMAL LOW (ref 6–20)
CO2: 22 mmol/L (ref 22–32)
Calcium: 8.2 mg/dL — ABNORMAL LOW (ref 8.9–10.3)
Chloride: 104 mmol/L (ref 98–111)
Creatinine, Ser: 0.43 mg/dL — ABNORMAL LOW (ref 0.44–1.00)
GFR, Estimated: >60 mL/min (ref >60)
Glucose, Bld: 94 mg/dL (ref 70–99)
Potassium: 2.8 mmol/L — ABNORMAL LOW (ref 3.5–5.1)
Sodium: 134 mmol/L — ABNORMAL LOW (ref 135–145)
Total Bilirubin: 0.9 mg/dL (ref 0.3–1.2)
Total Protein: 6 g/dL — ABNORMAL LOW (ref 6.5–8.1)

## 2021-04-25 LAB — HEPATITIS PANEL, ACUTE
HCV Ab: NONREACTIVE
Hep A IgM: NONREACTIVE
Hep B C IgM: NONREACTIVE
Hepatitis B Surface Ag: NONREACTIVE

## 2021-04-25 LAB — CBC
HCT: 29.8 % — ABNORMAL LOW (ref 36.0–46.0)
Hemoglobin: 10.1 g/dL — ABNORMAL LOW (ref 12.0–15.0)
MCH: 31.2 pg (ref 26.0–34.0)
MCHC: 33.9 g/dL (ref 30.0–36.0)
MCV: 92 fL (ref 80.0–100.0)
Platelets: 149 10*3/uL — ABNORMAL LOW (ref 150–400)
RBC: 3.24 MIL/uL — ABNORMAL LOW (ref 3.87–5.11)
RDW: 13.8 % (ref 11.5–15.5)
WBC: 5.1 10*3/uL (ref 4.0–10.5)
nRBC: 0 % (ref 0.0–0.2)

## 2021-04-25 LAB — GC/CHLAMYDIA PROBE AMP (~~LOC~~) NOT AT ARMC
Chlamydia: NEGATIVE
Comment: NEGATIVE
Comment: NORMAL
Neisseria Gonorrhea: NEGATIVE

## 2021-04-25 MED ORDER — POTASSIUM CHLORIDE CRYS ER 20 MEQ PO TBCR
30.0000 meq | EXTENDED_RELEASE_TABLET | Freq: Two times a day (BID) | ORAL | Status: DC
  Administered 2021-04-25: 30 meq via ORAL
  Filled 2021-04-25: qty 2

## 2021-04-25 MED ORDER — POTASSIUM CHLORIDE CRYS ER 20 MEQ PO TBCR
20.0000 meq | EXTENDED_RELEASE_TABLET | Freq: Two times a day (BID) | ORAL | 0 refills | Status: DC
Start: 2021-04-25 — End: 2021-05-19

## 2021-04-25 NOTE — Progress Notes (Signed)
CSW acknowledged consult for transportation needs and met with patient at bedside. CSW introduced self and explained consult. Patient was laying down in bed. CSW inquired about patient's transportation needs. Patient reported that she had initially needed a ride for her appointment on Friday but feels that she no longer needs to go due to being admitted and her water breaking. CSW asked if patient had issues with getting to her prenatal appointments previously, patient shared that she had not signed up for transportation services before. Patient shared that she attempted to sign her son up for [facility] Medicaid transportation in the past but was told that he was not eligible due to him having a different insurance (Medicaid). CSW informed patient that she could call the local [facility] to set up transportation for her son. Patient shared frustrations about contacting [employer], noting that the wait times are Benedicto Capozzi. CSW acknowledged and normalized patient's frustrations when it comes to contacting [employer] as it can take a Vertis Bauder time. Patient spoke about how it's stressful. CSW acknowledged how it can possibly provoke stress and encouraged patient to follow up when she feels that she able to. CSW informed patient that she can utilize [facility] transportation for her transportation as she has that insurance and offered the contact information. Patient reported that she already has the number. CSW asked if patient would have a ride home when ready for discharge, patient reported that she was uncertain because her boyfriend may be at work. CSW encouraged patient to notify CSW and or RN if she needs assistance arranging transportation home. CSW inquired about any additional needs, patient reported that she applied for utility assistance through [facility] and had not heard anything back yet. Patient also shared that she feels her food stamps should be increased as she is not working as much. Patient reported that she  contacted [facility] and had not heard anything back. CSW acknowledged patient's frustrations and encouraged patient to contact [employer] when she can to inquire about her resources and time frame. Patient verbalized understanding and denied any additional needs at this time. CSW asked patient was interested in a [facility] referral for care coordination, patient declined and reported that she has a care coordinator Licensed conveyancer) that works for [employer]. CSW provided patient with CSW contact information and encouraged patient to contact CSW if any needs arise.  ? ?Abundio Miu, LCSW ?Clinical Social Worker ?[facility] ?Cell#: [phone] ? ?

## 2021-04-25 NOTE — Progress Notes (Addendum)
Daily Antepartum Note  ?Admission Date: 04/24/2021 ?Current Date: 04/25/2021 ?9:08 AM ? ?Elizabeth Terrell is a 31 y.o. G2P1001 @ [redacted]w[redacted]d by LMP=5wk u/s, HD#2, admitted for PPROM at 20/2. ? ?Pregnancy complicated by: ?Patient Active Problem List  ? Diagnosis Date Noted  ? Preterm premature rupture of membranes (PPROM) with unknown onset of labor 04/24/2021  ? Chronic hypertension affecting pregnancy 04/24/2021  ? BMI 50.0-59.9, adult ([place]) 04/24/2021  ? Obesity in pregnancy 04/24/2021  ? Transaminitis 04/24/2021  ? Twin gestation in first trimester 01/15/2021  ? Bacterial vaginosis 01/15/2021  ? Spotting 01/15/2021  ? ? ?Overnight/24hr events:  ?none ? ?Subjective:  ?+LOF (clear-yellow). No VB, spotting or pain  ? ?Objective:  ? ? Current Vital Signs 24h Vital Sign Ranges  ?T 98.5 ?F (36.9 ?C) Temp  Avg: 98.2 ?F (36.8 ?C)  Min: 97.8 ?F (36.6 ?C)  Max: 98.7 ?F (37.1 ?C)  ?BP 127/69 BP  Min: 102/59  Max: 152/77  ?HR (!) 115 ? Pulse  Avg: 112.8  Min: 101  Max: 127  ?RR 18 Resp  Avg: 18  Min: 17  Max: 20  ?SaO2  (96) Room Air SpO2  Avg: 97.4 %  Min: 96 %  Max: 99 %  ?    ? 24 Hour I/O Current Shift I/O  ?Time ?Ins ?Outs 04/24 0701 - 04/25 0700 ?In: 557.9 [P.O.:30; I.V.:422.9] ?Out: -  No intake/output data recorded.  ? ?Fetal Heart Tones: 169//147 ?Tocometry: negative ? ?Physical exam: ?General: Well nourished, well developed female in no acute distress. ?Abdomen: obese, nttp ?Respiratory: no respiratory distress ?Extremities: no clubbing, cyanosis or edema ?Skin: Warm and dry.  ? ?Medications: ?Current Facility-Administered Medications  ?Medication Dose Route Frequency Provider Last Rate Last Admin  ? acetaminophen (TYLENOL) tablet 650 mg  650 mg Oral Q4H PRN Wende Mott, CNM      ? calcium carbonate (TUMS - dosed in mg elemental calcium) chewable tablet 400 mg of elemental calcium  2 tablet Oral Q4H PRN Wende Mott, CNM      ? lactated ringers infusion   Intravenous Continuous Rasch, Anderson Malta I, NP 125 mL/hr at  04/25/21 0037 New Bag at 04/25/21 0037  ? NIFEdipine (PROCARDIA-XL/NIFEDICAL-XL) 24 hr tablet 30 mg  30 mg Oral Daily Aletha Halim, MD      ? potassium chloride SA (KLOR-CON M) CR tablet 20 mEq  20 mEq Oral Daily Rasch, Anderson Malta I, NP   20 mEq at 04/24/21 1616  ? prenatal multivitamin tablet 1 tablet  1 tablet Oral Q1200 Len Blalock M, CNM      ? zolpidem (AMBIEN) tablet 5 mg  5 mg Oral QHS PRN Wende Mott, CNM      ? ? ?Labs:  ?Recent Labs  ?Lab 04/24/21 ?1151 04/25/21 ?0441  ?WBC 6.5 5.1  ?HGB 11.1* 10.1*  ?HCT 32.7* 29.8*  ?PLT 166 149*  ? ? ?Recent Labs  ?Lab 04/24/21 ?1151 04/25/21 ?0441  ?NA 135 134*  ?K 2.9* 2.8*  ?CL 101 104  ?CO2 23 22  ?BUN <5* <5*  ?CREATININE 0.46 0.43*  ?CALCIUM 8.3* 8.2*  ?PROT 6.7 6.0*  ?BILITOT 1.3* 0.9  ?ALKPHOS 41 34*  ?ALT 45* 39  ?AST 72* 56*  ?GLUCOSE 100* 94  ? ?Pending: acute hep panel ?Radiology:  ?No new imaging ? ?Assessment & Plan:  ?Pt stable ?*Pregnancy:maternal fetal status stable ?*PPROM: no e/o infection, PTL, VB. Pt would like MFM and NICU consult this morning and they were called. Continue to hold off  on interventions given pre-viable ?*Preterm: see above ?*Heme: follow plts ?*PPx: SCDs, OOB ad lib ?*FEN/GI: regular diet, SLIV, continue kdur. Likely fatty liver. Cmp better, f/u pending acute hep panel ?*Dispo: today if stays stable and after consults ? ?Durene Romans MD ?Attending ?Center for [employer] Fish farm manager) ?GYN Consult Phone: [phone] (M-F, [phone]) & [phone]  (Off hours, weekends, holidays) ? ?

## 2021-04-25 NOTE — Discharge Summary (Signed)
Physician Discharge Summary  ?Patient ID: ?Elizabeth Terrell ?MRN: [id number] ?DOB/AGE: 1990/01/27 30 y.o. ? ?Admit date: 04/24/2021 ?Discharge date: 04/25/2021 ? ?Admission Diagnoses: Pregnancy at 20/2 weeks by LMP=5 week ultrasound. PPROM at 20/2. Cervical insufficiency diagnosed 1 week prior. Worsened cervical dilation. Dichorionic-diamniotic twins. ? ?Secondary diagnoses: BMI 50s. Chronic hypertension. History of pre-eclampsia. Elevated AST/ALT ? ?Discharge Diagnoses:  ?Pregnancy at 20/3 weeks.  ? ?Discharged Condition: good ? ?Hospital Course: Patient presented to MAU for LOF x 1 week and was diagnosed with PPROM. Her OB provider is [facility] and they diagnosed her at her anatomy u/s with cervical insufficiency on 4/17 with 12mm cervix and u shaped funneling on u/s and 1cm dilation on speculum exam. Pt opted for vaginal progesterone and had stable repeat u/s on 4/21 with [facility].  ? ?In MAU, she was noted to be 2-3cm dilated; pt denies any VB or pain during admission or her hospital stay. She was observed overnight to ensure she didn't go into preterm labor,which she did not. D/w pt re: no current interventions indicated but she was upset and requested transfer to [facility], due to her transportation difficulties, but they were called but they declined transfer.  ? ?She was seen by MFM and NICU and d/w her re: only plan of care, given pre-viability, would be to do expectant management as an outpatient until 22-23wks. At [facility], we discussed with her re: outpatient steroids at 22/6 and admission at 23/0 ? ?Discharge Exam: ?Blood pressure 122/61, pulse (!) 105, temperature 97.7 ?F (36.5 ?C), temperature source Oral, resp. rate 19, height 5\' 2"  (1.575 m), weight (!) 143.8 kg, last menstrual period 12/03/2020, SpO2 99 %. ?General appearance: alert ?Resp: clear to auscultation bilaterally ?Cardio: regular rate and rhythm, S1, S2 normal, no murmur, click, rub or gallop ?GI: soft, non-tender; bowel sounds normal; no masses,  no  organomegaly ?Extremities: extremities normal, atraumatic, no cyanosis or edema ?Skin: Skin color, texture, turgor normal. No rashes or lesions ?Neurologic: Grossly normal ? ?Disposition:  ?Discharge disposition: 01-Home or Self Care ? ? ? ? ? ? ?Discharge Instructions   ? ? Discharge patient   Complete by: As directed ?  ? Discharge disposition: 01-Home or Self Care  ? Discharge patient date: 04/25/2021  ? ?  ? ?Allergies as of 04/25/2021   ?No Known Allergies ?  ? ?  ?Medication List  ?  ? ?STOP taking these medications   ? ?progesterone 200 MG capsule ?Commonly known as: PROMETRIUM ?  ? ?  ? ?TAKE these medications   ? ?aspirin 81 MG chewable tablet ?Chew 81 mg by mouth daily. ?  ?metroNIDAZOLE 500 MG tablet ?Commonly known as: FLAGYL ?Take 1 tablet (500 mg total) by mouth 2 (two) times daily. ?  ?multivitamin-prenatal 27-0.8 MG Tabs tablet ?Take 1 tablet by mouth daily at 12 noon. ?  ?NIFEdipine 30 MG 24 hr tablet ?Commonly known as: PROCARDIA-XL/NIFEDICAL-XL ?Take 30 mg by mouth daily. ?  ?potassium chloride SA 20 MEQ tablet ?Commonly known as: KLOR-CON M ?Take 1 tablet (20 mEq total) by mouth 2 (two) times daily for 8 doses. ?  ? ?  ? ? Follow-up Information   ? ? [facility]. Go in 3 day(s).   ?Specialty: Maternal and Fetal Medicine ?Why: for a repeat ultrasound ?Contact information: ?[address], [address] ?[place] ?[phone] ? ?  ?  ? ?  ?  ? ?  ? ? ?Signed: ?[phone] ?04/25/2021, 3:11 PM ? ? ?

## 2021-04-25 NOTE — Consult Note (Signed)
MFM Note ? ?Elizabeth Terrell is a 31 year old gravida 2 para 1 currently at 20 weeks and 3 days with a spontaneously conceived dichorionic, diamniotic twin gestation.  She was admitted yesterday due to leakage of fluid.   ? ?Rupture of membranes was confirmed as there was active fluid leaking from her cervix, as she had a positive AmniSure test, and as there was ferning noted.  Her cervix was noted to be 2 cm dilated and 50% on admission yesterday. ? ?The patient was noted to have a shortened cervix noted during her fetal anatomy scan performed with MFM at [facility] last week.  She was offered a cerclage versus daily vaginal progesterone.  The patient opted to use the daily vaginal progesterone. ? ?The patient reports that she started to leak fluid about 5 days ago.  She is leaking less fluid today.  She denies any signs or symptoms of an intrauterine infection. ? ?Her past obstetrical history includes one prior uncomplicated full-term vaginal delivery. ? ?Her current pregnancy has also been complicated by maternal obesity and chronic hypertension treated with Procardia 30 mg daily. ? ?A limited ultrasound performed today shows normal amniotic fluid around both twin A and twin B.   ? ?Twin A has a maximal vertical pocket of amniotic fluid of 6 cm.  Twin B has a maximal vertical pocket of 5.7 cm. ? ?The poor prognosis associated with PPROM at her current gestational age was discussed with the patient.  She was advised that due to rupture of membranes along with cervical dilatation, there is a high likelihood that she will go into spontaneous labor and deliver both fetuses in the near future.   ? ?She understands that there is a low likelihood of survival should she deliver prior to viability, which is currently considered at around 23 weeks.   ? ?The increased risk of fetal pulmonary hypoplasia and fetal contractures associated with early rupture of membranes was discussed.  As the amniotic fluid  levels are still within normal limits, the chances of these complications occurring are low at this time.   ? ?The patient understands that she will most likely continue to lose amniotic fluid unless the membranes reseal. ? ?As the fetus' are not considered viable at her current gestational age, she may be discharged home later today. ? ?I will schedule another ultrasound for fetal assessment at the end of this week.   ? ?We will continue to follow her with weekly ultrasounds for fetal assessment until about 23 weeks at which time she should be readmitted for continued inpatient management until delivery. ? ?We will plan on administering a complete course of antenatal corticosteroids starting at 22 weeks and 5 days. ? ?As she has ruptured membranes, pelvic rest was advised.  She was also advised to discontinue the daily vaginal progesterone as the vaginal progesterone may have limited benefits in someone who has ruptured membranes. ? ?She was advised to return to the hospital should she experience frequent contractions, should she show any signs or symptoms of an intrauterine infection such as fever or shaking chills, or for decreased fetal movements. ? ?I will have someone from our office contact the patient to schedule her future ultrasound exams in our office.   ? ?The patient stated that all of her questions have been answered today and is comfortable and agreeable with the management plan discussed above. ? ?

## 2021-04-25 NOTE — Progress Notes (Signed)
Discharge instructions and prescriptions given to pt. Discussed signs and symptoms to report to the MD, upcoming appointments, and meds. Pt verbalizes understanding and has no questions or concerns at this time. Pt discharged home from hospital in stable condition. 

## 2021-04-25 NOTE — Consult Note (Signed)
Neonatal-Perinatal Medicine Consult Note ? ? ?Jamaika Brasseaux ?[id number] ? ?I was asked by Dr. Vergie Living to consult on this patient for possible periviable preterm delivery.  I had the pleasure of meeting with Ms. Noxon today. The babies' father was also present via phone.   She is a 31 y.o. G2P1001 at 20+3 weeks with di/di twins who presented to MAU yesterday with PROM on 4/20.  They are expecting 1 boy and 1 girl.  ? ?Counseling ?We discussed that at their current gestational age of [redacted] weeks, the twins are considered pre-viable, meaning they would not survive despite maximum crticial care and for that reason, NICU intervention is not offered.  We then discussed what it would mean if Ms. Erle made it to 22-[redacted] weeks gestation, which is considered peri-viable.  We discussed the poor overall survival rate at this gestational age, and the even lower survival rate without significant neurologic impairment.  We discussed the expected hospital length of stay and some of the significant problems associated with extreme prematurity including respiratory distress syndrome/CLD, IVH/PVL, ROP, NEC, and infection risk. I explained that should they choose critical care interventions, the infant would be intubated immediately in the delivery room.  They understands that despite maximum critical care interventions, their babies may encounter complications that he/she may not be able to survive.  They also understand that if the babies do survive, he/she may have life-long medical complexities such as blindness, cerebral palsy, and significant developmental delay. ?  ?Given the above information, I explained that there is joint medical decision making for infant's born 22-[redacted] weeks gestation and that there are three options should she delivery prior to 25 weeks ?  ?1. Provide maximum critical care intervention, utilizing every measure possible to save their baby's life.   ?2. Do not initiate critical care, and instead provide  comfort care until the baby passes, allowing parents to hold if they desire.   ?3. Initiate intensive care and see how her baby responds, being open to the idea of with-drawling care should significant complications arise.  ? ?I explained that there is no right or wrong decision here, that each family has their own set of values and definitions of quality of life which make this a very personal choice. ?  ?I also explained that due to the even higher risk of morbidity/mortality at [redacted] weeks gestation, this institution does not currently offer resuscitation at 22 weeks.  However, there are other hospitals that would attempt intervention at her current gestation age should that be the parent's wishes.   ?  ?Plan ?Ms. Mccalvin and her partner want any and all interventions that may be offered to their children should she stay pregnant until [redacted] weeks gestation.  Should it seem that she may deliver at [redacted] weeks gestation, Ms. Hamacher should be instructed to go to/offered transfer to a local quatern[facility] care teaching hospital that provides intervention at this gestation age, assuming she is medically stable for such a transfer.  [facility], and [facility] all offer resuscitation at 22 weeks.  If she does not threaten delivery prior to [redacted] weeks gestation, we will be available to offer intervention here in this hospital. Please consider betamethasone for her starting 48 hours prior to their decided gestational age that they would like to initiate intensive care measures for the baby.  ?  ?Thank you for involving [place] in the care of this patient. A member of our team will be available should the family have additional questions.  Time for consultation was approximately >40 minutes, of which more than half was face-to-face.  ? ?_____________________ ?Towana Badger, MD ?Neonatal Medicine ?04/25/2021             12:55 PM ? ?

## 2021-04-26 ENCOUNTER — Telehealth: Payer: 59

## 2021-04-26 ENCOUNTER — Other Ambulatory Visit: Payer: Self-pay | Admitting: *Deleted

## 2021-04-26 DIAGNOSIS — O42919 Preterm premature rupture of membranes, unspecified as to length of time between rupture and onset of labor, unspecified trimester: Secondary | ICD-10-CM

## 2021-04-26 DIAGNOSIS — O30042 Twin pregnancy, dichorionic/diamniotic, second trimester: Secondary | ICD-10-CM

## 2021-04-28 ENCOUNTER — Ambulatory Visit (HOSPITAL_BASED_OUTPATIENT_CLINIC_OR_DEPARTMENT_OTHER): Payer: Medicaid Other

## 2021-04-28 ENCOUNTER — Ambulatory Visit: Payer: Medicaid Other | Attending: Obstetrics

## 2021-04-28 ENCOUNTER — Other Ambulatory Visit: Payer: Self-pay | Admitting: *Deleted

## 2021-04-28 ENCOUNTER — Ambulatory Visit: Payer: Medicaid Other | Admitting: *Deleted

## 2021-04-28 ENCOUNTER — Encounter: Payer: Self-pay | Admitting: *Deleted

## 2021-04-28 VITALS — BP 135/90 | HR 102

## 2021-04-28 DIAGNOSIS — O42919 Preterm premature rupture of membranes, unspecified as to length of time between rupture and onset of labor, unspecified trimester: Secondary | ICD-10-CM

## 2021-04-28 DIAGNOSIS — O10912 Unspecified pre-existing hypertension complicating pregnancy, second trimester: Secondary | ICD-10-CM

## 2021-04-28 DIAGNOSIS — O3432 Maternal care for cervical incompetence, second trimester: Secondary | ICD-10-CM

## 2021-04-28 DIAGNOSIS — O30042 Twin pregnancy, dichorionic/diamniotic, second trimester: Secondary | ICD-10-CM | POA: Diagnosis not present

## 2021-04-28 DIAGNOSIS — O10012 Pre-existing essential hypertension complicating pregnancy, second trimester: Secondary | ICD-10-CM | POA: Diagnosis not present

## 2021-04-28 DIAGNOSIS — O99212 Obesity complicating pregnancy, second trimester: Secondary | ICD-10-CM

## 2021-04-28 DIAGNOSIS — O42912 Preterm premature rupture of membranes, unspecified as to length of time between rupture and onset of labor, second trimester: Secondary | ICD-10-CM

## 2021-04-28 DIAGNOSIS — Z369 Encounter for antenatal screening, unspecified: Secondary | ICD-10-CM

## 2021-04-28 DIAGNOSIS — Z3A20 20 weeks gestation of pregnancy: Secondary | ICD-10-CM

## 2021-05-05 ENCOUNTER — Encounter: Payer: Self-pay | Admitting: *Deleted

## 2021-05-05 ENCOUNTER — Other Ambulatory Visit: Payer: Self-pay | Admitting: Obstetrics

## 2021-05-05 ENCOUNTER — Ambulatory Visit: Payer: Medicaid Other | Admitting: *Deleted

## 2021-05-05 ENCOUNTER — Ambulatory Visit: Payer: Medicaid Other | Attending: Obstetrics

## 2021-05-05 VITALS — BP 142/56 | HR 95

## 2021-05-05 DIAGNOSIS — O30042 Twin pregnancy, dichorionic/diamniotic, second trimester: Secondary | ICD-10-CM | POA: Diagnosis not present

## 2021-05-05 DIAGNOSIS — O10912 Unspecified pre-existing hypertension complicating pregnancy, second trimester: Secondary | ICD-10-CM | POA: Insufficient documentation

## 2021-05-05 DIAGNOSIS — O10012 Pre-existing essential hypertension complicating pregnancy, second trimester: Secondary | ICD-10-CM

## 2021-05-05 DIAGNOSIS — O3432 Maternal care for cervical incompetence, second trimester: Secondary | ICD-10-CM

## 2021-05-05 DIAGNOSIS — O42912 Preterm premature rupture of membranes, unspecified as to length of time between rupture and onset of labor, second trimester: Secondary | ICD-10-CM | POA: Diagnosis not present

## 2021-05-05 DIAGNOSIS — Z3A21 21 weeks gestation of pregnancy: Secondary | ICD-10-CM

## 2021-05-05 DIAGNOSIS — Z362 Encounter for other antenatal screening follow-up: Secondary | ICD-10-CM

## 2021-05-11 ENCOUNTER — Ambulatory Visit (INDEPENDENT_AMBULATORY_CARE_PROVIDER_SITE_OTHER): Payer: Medicaid Other | Admitting: Family Medicine

## 2021-05-11 ENCOUNTER — Ambulatory Visit (HOSPITAL_BASED_OUTPATIENT_CLINIC_OR_DEPARTMENT_OTHER): Payer: Medicaid Other | Admitting: Obstetrics

## 2021-05-11 ENCOUNTER — Other Ambulatory Visit: Payer: Self-pay | Admitting: *Deleted

## 2021-05-11 ENCOUNTER — Encounter: Payer: Self-pay | Admitting: Family Medicine

## 2021-05-11 ENCOUNTER — Ambulatory Visit: Payer: Medicaid Other | Admitting: *Deleted

## 2021-05-11 ENCOUNTER — Ambulatory Visit: Payer: Medicaid Other | Attending: Obstetrics

## 2021-05-11 VITALS — BP 126/85 | HR 91 | Wt 322.0 lb

## 2021-05-11 VITALS — BP 138/89 | HR 96

## 2021-05-11 DIAGNOSIS — O3432 Maternal care for cervical incompetence, second trimester: Secondary | ICD-10-CM

## 2021-05-11 DIAGNOSIS — O30042 Twin pregnancy, dichorionic/diamniotic, second trimester: Secondary | ICD-10-CM

## 2021-05-11 DIAGNOSIS — O10919 Unspecified pre-existing hypertension complicating pregnancy, unspecified trimester: Secondary | ICD-10-CM

## 2021-05-11 DIAGNOSIS — O0990 Supervision of high risk pregnancy, unspecified, unspecified trimester: Secondary | ICD-10-CM | POA: Diagnosis not present

## 2021-05-11 DIAGNOSIS — O99212 Obesity complicating pregnancy, second trimester: Secondary | ICD-10-CM | POA: Diagnosis present

## 2021-05-11 DIAGNOSIS — O42919 Preterm premature rupture of membranes, unspecified as to length of time between rupture and onset of labor, unspecified trimester: Secondary | ICD-10-CM | POA: Diagnosis not present

## 2021-05-11 DIAGNOSIS — O10012 Pre-existing essential hypertension complicating pregnancy, second trimester: Secondary | ICD-10-CM | POA: Diagnosis not present

## 2021-05-11 DIAGNOSIS — O30049 Twin pregnancy, dichorionic/diamniotic, unspecified trimester: Secondary | ICD-10-CM

## 2021-05-11 DIAGNOSIS — O10912 Unspecified pre-existing hypertension complicating pregnancy, second trimester: Secondary | ICD-10-CM

## 2021-05-11 DIAGNOSIS — O42112 Preterm premature rupture of membranes, onset of labor more than 24 hours following rupture, second trimester: Secondary | ICD-10-CM | POA: Diagnosis present

## 2021-05-11 DIAGNOSIS — Z3A22 22 weeks gestation of pregnancy: Secondary | ICD-10-CM

## 2021-05-11 DIAGNOSIS — E669 Obesity, unspecified: Secondary | ICD-10-CM

## 2021-05-11 DIAGNOSIS — O4292 Full-term premature rupture of membranes, unspecified as to length of time between rupture and onset of labor: Secondary | ICD-10-CM | POA: Diagnosis not present

## 2021-05-11 DIAGNOSIS — O99210 Obesity complicating pregnancy, unspecified trimester: Secondary | ICD-10-CM

## 2021-05-11 DIAGNOSIS — R7401 Elevation of levels of liver transaminase levels: Secondary | ICD-10-CM

## 2021-05-11 DIAGNOSIS — O99119 Other diseases of the blood and blood-forming organs and certain disorders involving the immune mechanism complicating pregnancy, unspecified trimester: Secondary | ICD-10-CM

## 2021-05-11 DIAGNOSIS — E876 Hypokalemia: Secondary | ICD-10-CM

## 2021-05-11 DIAGNOSIS — D696 Thrombocytopenia, unspecified: Secondary | ICD-10-CM

## 2021-05-11 DIAGNOSIS — G479 Sleep disorder, unspecified: Secondary | ICD-10-CM

## 2021-05-11 MED ORDER — UNISOM SLEEPTABS 25 MG PO TABS: 25.0000 mg | ORAL_TABLET | Freq: Every evening | ORAL | 1 refills | Status: DC | PRN

## 2021-05-11 NOTE — Progress Notes (Signed)
? ?PRENATAL VISIT NOTE ? ?Subjective:  ?Elizabeth Terrell is a 31 y.o. G2P1001 at 2w5dbeing seen today for ongoing prenatal care.  [facility]  She is currently monitored for the following issues for this high-risk pregnancy and has Twin gestation in first trimester; Bacterial vaginosis; Spotting; Preterm premature rupture of membranes (PPROM) with unknown onset of labor; Chronic hypertension affecting pregnancy; BMI 50.0-59.9, adult ([place]; Obesity in pregnancy; Transaminitis; Hypokalemia; and Gestational thrombocytopenia ([place] on their problem list. ? ?Patient reports no complaints.  Contractions: Not present. Vag. Bleeding: None.  Movement: Present. Denies leaking of fluid.  ? ?The following portions of the patient's history were reviewed and updated as appropriate: allergies, current medications, past family history, past medical history, past social history, past surgical history and problem list.  ? ?Objective:  ? ?Vitals:  ? 05/11/21 1558  ?BP: 126/85  ?Pulse: 91  ?Weight: (!) 322 lb (146.1 kg)  ? ?Fetal Status: Normal FHTs at MFM appt today on [place] Movement: Present. ? ?General:  Alert, oriented and cooperative. Patient is in no acute distress.  ?Skin: Skin is warm and dry. No rash noted.   ?Cardiovascular: Normal heart rate noted.  ?Respiratory: Normal respiratory effort, no problems with respiration noted.  ?Abdomen: Soft, gravid, appropriate for gestational age.       ?Pelvic: Cervical exam deferred.  ?Extremities: Normal range of motion.    ?Mental Status: Normal mood and affect. Normal behavior. Normal judgment and thought content.  ? ?Assessment and Plan:  ?Pregnancy: G2P1001 at 250w5d ?1. Supervision of high risk pregnancy, antepartum ?2. [redacted] weeks gestation of pregnancy ?3. Dichorionic diamniotic twin pregnancy, antepartum ?4. Preterm premature rupture of membranes (PPROM) with unknown onset of labor ?5. Cervical  shortening ?Progressing well.  No longer leaking any fluid.  Evaluated by MFM this morning with Dr. FaAnnamaria Boots Normal amniotic fluid around both babies.  Given that membrane's may have resealed, patient deemed stable for further outpatient management per MFM instead of admission as previously planned.  Patient will have follow up [place] cervical length next week given hx of cervical shortening this pregnancy. If funneling is present, plan to administer complete course of steroids. PTL precautions reviewed with patient, she voiced understanding. [facility] ? ?6. Obesity in pregnancy ? ?7. Chronic hypertension affecting pregnancy ?BP within normal limits on Procardia 30 mg. Taking ASA 81 mg as well. Will continue current regimen for now.  ? ?8. Transaminitis ?Previously elevated in the setting of HEG and gallstones s/p cholecystectomy. LFTs have improved overall since prior. Nausea and vomiting recently stopped per patient. Will repeat today and follow up results.   ?- Comp Met (CMET) ? ?9. Hypokalemia ?Last K low at 2.8 in the setting of N/V and HEG as noted above. Will repeat today and replete as needed.  ?- Comp Met (CMET) ? ?10. Thrombocytopenia affecting pregnancy ([facility] [place]?Last platelets 149. Will repeat CBC today to reassess trend.  ?- CBC ? ?11. Trouble in sleeping ?Will trial Unisom and reassess at next visit.  ?- doxylamine, Sleep, (UNISOM SLEEPTABS) 25 MG tablet; Take 1 tablet (25 mg total) by mouth at bedtime as needed.  Dispense: 30 tablet; Refill: 1 ? ?Preterm labor symptoms and general obstetric precautions including but not limited to vaginal bleeding, contractions, leaking of fluid and fetal movement were reviewed in detail with the patient. ? ?Please refer to  After Visit Summary for other counseling recommendations.  ? ?Return in about 4 weeks (around 06/08/2021) for follow up [facility] OB visit. ? ?Future Appointments  ?Date Time Provider Bray  ?05/18/2021  10:00 AM [facility] NURSE [facility]  ?05/19/2021 10:00 AM [facility] NURSE [facility]  ?06/08/2021 10:30 AM [facility] NURSE [facility]  ?06/08/2021 10:45 AM [facility] US4 [facility]  ?06/12/2021 11:15 AM Luvenia Redden, PA-C [facility]  ? ?Genia Del, MD ? ?

## 2021-05-11 NOTE — Progress Notes (Signed)
MFM Note ? ?Elizabeth Terrell was seen for a limited ultrasound due to a dichorionic, diamniotic twin gestation where rupture of membranes was diagnosed few weeks ago.  The patient reports that she has not leaked any amniotic fluid for over a week.  The amniotic fluid levels around both fetuses have appeared within normal limits since she was diagnosed with rupture of membranes.  She denies any contractions and denies any signs of an intrauterine infection.   ? ?On today's exam, there continues to be normal amniotic fluid noted around both fetuses.   ? ?Due to PPROM, the plan had been to administer a complete course of antenatal corticosteroids at 22 weeks and 5 days with the plan for hospitalization and continued inpatient management until delivery starting at 23 weeks. ? ?However as the patient's membranes may have resealed, as she is no longer leaking fluid and as the amniotic fluid levels around both fetuses have always been within normal limits, she may not require hospitalization and inpatient management. ? ?As cervical shortening was noted earlier in her pregnancy, I checked her cervix today and found that her cervix is closed. ? ?We will continue to follow her as an outpatient with weekly ultrasound exams for fetal assessment.   ? ?She will return in 1 week for a growth ultrasound and transvaginal ultrasound to assess her cervical length.   ? ?Should cervical funneling be noted next week (indicating that she may be at risk for a preterm birth), we will plan on administering a complete course of antenatal corticosteroids at that time. ? ?The patient understands that she should go to the hospital should she feel any further leakage of fluid, should she have any signs or symptoms of an intrauterine infection, or should she feel contractions.  ? ?The patient is happy and comfortable with this management plan.   ? ?The patient stated that all of her questions were answered. ? ?A total of 20 minutes was spent  counseling and coordinating the care for this patient.  Greater than 50% of the time was spent in direct face-to-face contact. ?

## 2021-05-12 ENCOUNTER — Encounter: Payer: Self-pay | Admitting: Family Medicine

## 2021-05-12 LAB — CBC
Hematocrit: 31.8 % — ABNORMAL LOW (ref 34.0–46.6)
Hemoglobin: 10.5 g/dL — ABNORMAL LOW (ref 11.1–15.9)
MCH: 30.7 pg (ref 26.6–33.0)
MCHC: 33 g/dL (ref 31.5–35.7)
MCV: 93 fL (ref 79–97)
Platelets: 239 10*3/uL (ref 150–450)
RBC: 3.42 x10E6/uL — ABNORMAL LOW (ref 3.77–5.28)
RDW: 13.7 % (ref 11.7–15.4)
WBC: 8.9 10*3/uL (ref 3.4–10.8)

## 2021-05-12 LAB — COMPREHENSIVE METABOLIC PANEL
ALT: 47 IU/L — ABNORMAL HIGH (ref 0–32)
AST: 48 IU/L — ABNORMAL HIGH (ref 0–40)
Albumin/Globulin Ratio: 1 — ABNORMAL LOW (ref 1.2–2.2)
Albumin: 3.6 g/dL — ABNORMAL LOW (ref 3.8–4.8)
Alkaline Phosphatase: 47 IU/L (ref 44–121)
BUN/Creatinine Ratio: 10 (ref 9–23)
BUN: 4 mg/dL — ABNORMAL LOW (ref 6–20)
Bilirubin Total: 0.6 mg/dL (ref 0.0–1.2)
CO2: 19 mmol/L — ABNORMAL LOW (ref 20–29)
Calcium: 8.9 mg/dL (ref 8.7–10.2)
Chloride: 99 mmol/L (ref 96–106)
Creatinine, Ser: 0.4 mg/dL — ABNORMAL LOW (ref 0.57–1.00)
Globulin, Total: 3.6 g/dL (ref 1.5–4.5)
Glucose: 79 mg/dL (ref 70–99)
Potassium: 3.5 mmol/L (ref 3.5–5.2)
Sodium: 135 mmol/L (ref 134–144)
Total Protein: 7.2 g/dL (ref 6.0–8.5)
eGFR: 135 mL/min/{1.73_m2} (ref >59)

## 2021-05-18 ENCOUNTER — Ambulatory Visit: Payer: Medicaid Other | Attending: Obstetrics

## 2021-05-18 ENCOUNTER — Ambulatory Visit: Payer: Medicaid Other

## 2021-05-18 DIAGNOSIS — O10912 Unspecified pre-existing hypertension complicating pregnancy, second trimester: Secondary | ICD-10-CM | POA: Diagnosis not present

## 2021-05-18 DIAGNOSIS — O42912 Preterm premature rupture of membranes, unspecified as to length of time between rupture and onset of labor, second trimester: Secondary | ICD-10-CM | POA: Insufficient documentation

## 2021-05-18 DIAGNOSIS — O30042 Twin pregnancy, dichorionic/diamniotic, second trimester: Secondary | ICD-10-CM | POA: Diagnosis present

## 2021-05-18 DIAGNOSIS — Z3A23 23 weeks gestation of pregnancy: Secondary | ICD-10-CM | POA: Diagnosis not present

## 2021-05-18 DIAGNOSIS — O99212 Obesity complicating pregnancy, second trimester: Secondary | ICD-10-CM | POA: Diagnosis not present

## 2021-05-18 DIAGNOSIS — O3432 Maternal care for cervical incompetence, second trimester: Secondary | ICD-10-CM | POA: Diagnosis not present

## 2021-05-18 MED ORDER — BETAMETHASONE SOD PHOS & ACET 6 (3-3) MG/ML IJ SUSP
12.0000 mg | Freq: Once | INTRAMUSCULAR | Status: AC
  Administered 2021-05-18: 12 mg via INTRAMUSCULAR

## 2021-05-19 ENCOUNTER — Ambulatory Visit: Payer: Medicaid Other

## 2021-05-19 ENCOUNTER — Ambulatory Visit: Payer: Medicaid Other | Admitting: *Deleted

## 2021-05-19 ENCOUNTER — Ambulatory Visit (HOSPITAL_BASED_OUTPATIENT_CLINIC_OR_DEPARTMENT_OTHER): Payer: Medicaid Other

## 2021-05-19 VITALS — BP 135/80 | HR 120

## 2021-05-19 DIAGNOSIS — O99212 Obesity complicating pregnancy, second trimester: Secondary | ICD-10-CM

## 2021-05-19 DIAGNOSIS — O30042 Twin pregnancy, dichorionic/diamniotic, second trimester: Secondary | ICD-10-CM

## 2021-05-19 DIAGNOSIS — O10012 Pre-existing essential hypertension complicating pregnancy, second trimester: Secondary | ICD-10-CM | POA: Diagnosis not present

## 2021-05-19 DIAGNOSIS — E669 Obesity, unspecified: Secondary | ICD-10-CM

## 2021-05-19 DIAGNOSIS — Z3A23 23 weeks gestation of pregnancy: Secondary | ICD-10-CM

## 2021-05-19 DIAGNOSIS — O3432 Maternal care for cervical incompetence, second trimester: Secondary | ICD-10-CM | POA: Diagnosis not present

## 2021-05-19 DIAGNOSIS — O42912 Preterm premature rupture of membranes, unspecified as to length of time between rupture and onset of labor, second trimester: Secondary | ICD-10-CM | POA: Diagnosis not present

## 2021-05-19 DIAGNOSIS — O42919 Preterm premature rupture of membranes, unspecified as to length of time between rupture and onset of labor, unspecified trimester: Secondary | ICD-10-CM

## 2021-05-19 MED ORDER — BETAMETHASONE SOD PHOS & ACET 6 (3-3) MG/ML IJ SUSP
12.0000 mg | Freq: Once | INTRAMUSCULAR | Status: AC
Start: 2021-05-19 — End: 2021-05-19
  Administered 2021-05-19: 12 mg via INTRAMUSCULAR

## 2021-05-25 ENCOUNTER — Other Ambulatory Visit: Payer: Self-pay

## 2021-05-25 ENCOUNTER — Ambulatory Visit: Payer: Medicaid Other | Attending: Obstetrics | Admitting: Obstetrics

## 2021-05-25 ENCOUNTER — Encounter (HOSPITAL_COMMUNITY): Payer: Self-pay | Admitting: Obstetrics

## 2021-05-25 ENCOUNTER — Inpatient Hospital Stay (HOSPITAL_COMMUNITY)
Admission: AD | Admit: 2021-05-25 | Discharge: 2021-05-25 | Payer: Medicaid Other | Attending: Obstetrics | Admitting: Obstetrics

## 2021-05-25 ENCOUNTER — Ambulatory Visit: Payer: Medicaid Other | Admitting: *Deleted

## 2021-05-25 ENCOUNTER — Other Ambulatory Visit: Payer: Self-pay | Admitting: Obstetrics

## 2021-05-25 ENCOUNTER — Ambulatory Visit: Payer: Medicaid Other | Attending: Obstetrics

## 2021-05-25 VITALS — BP 116/75 | HR 101

## 2021-05-25 DIAGNOSIS — O10912 Unspecified pre-existing hypertension complicating pregnancy, second trimester: Secondary | ICD-10-CM | POA: Insufficient documentation

## 2021-05-25 DIAGNOSIS — O30042 Twin pregnancy, dichorionic/diamniotic, second trimester: Secondary | ICD-10-CM | POA: Insufficient documentation

## 2021-05-25 DIAGNOSIS — E669 Obesity, unspecified: Secondary | ICD-10-CM

## 2021-05-25 DIAGNOSIS — O99212 Obesity complicating pregnancy, second trimester: Secondary | ICD-10-CM

## 2021-05-25 DIAGNOSIS — K760 Fatty (change of) liver, not elsewhere classified: Secondary | ICD-10-CM | POA: Insufficient documentation

## 2021-05-25 DIAGNOSIS — O365922 Maternal care for other known or suspected poor fetal growth, second trimester, fetus 2: Secondary | ICD-10-CM

## 2021-05-25 DIAGNOSIS — O365921 Maternal care for other known or suspected poor fetal growth, second trimester, fetus 1: Secondary | ICD-10-CM

## 2021-05-25 DIAGNOSIS — O42919 Preterm premature rupture of membranes, unspecified as to length of time between rupture and onset of labor, unspecified trimester: Secondary | ICD-10-CM | POA: Diagnosis present

## 2021-05-25 DIAGNOSIS — B9689 Other specified bacterial agents as the cause of diseases classified elsewhere: Secondary | ICD-10-CM | POA: Diagnosis not present

## 2021-05-25 DIAGNOSIS — O42912 Preterm premature rupture of membranes, unspecified as to length of time between rupture and onset of labor, second trimester: Secondary | ICD-10-CM

## 2021-05-25 DIAGNOSIS — Z3A24 24 weeks gestation of pregnancy: Secondary | ICD-10-CM

## 2021-05-25 DIAGNOSIS — O26612 Liver and biliary tract disorders in pregnancy, second trimester: Secondary | ICD-10-CM | POA: Insufficient documentation

## 2021-05-25 DIAGNOSIS — O3432 Maternal care for cervical incompetence, second trimester: Secondary | ICD-10-CM | POA: Diagnosis not present

## 2021-05-25 DIAGNOSIS — O10012 Pre-existing essential hypertension complicating pregnancy, second trimester: Secondary | ICD-10-CM

## 2021-05-25 DIAGNOSIS — O42112 Preterm premature rupture of membranes, onset of labor more than 24 hours following rupture, second trimester: Secondary | ICD-10-CM

## 2021-05-25 DIAGNOSIS — O23592 Infection of other part of genital tract in pregnancy, second trimester: Secondary | ICD-10-CM | POA: Insufficient documentation

## 2021-05-25 DIAGNOSIS — O10919 Unspecified pre-existing hypertension complicating pregnancy, unspecified trimester: Secondary | ICD-10-CM

## 2021-05-25 LAB — URINALYSIS, ROUTINE W REFLEX MICROSCOPIC
Bilirubin Urine: NEGATIVE
Glucose, UA: NEGATIVE mg/dL
Hgb urine dipstick: NEGATIVE
Ketones, ur: 20 mg/dL — AB
Nitrite: NEGATIVE
Protein, ur: 30 mg/dL — AB
Specific Gravity, Urine: 1.024 (ref 1.005–1.030)
WBC, UA: >50 WBC/hpf — ABNORMAL HIGH (ref 0–5)
pH: 6 (ref 5.0–8.0)

## 2021-05-25 LAB — OB RESULTS CONSOLE RPR: RPR: NONREACTIVE

## 2021-05-25 LAB — TYPE AND SCREEN
ABO/RH(D): A POS
Antibody Screen: NEGATIVE

## 2021-05-25 LAB — WET PREP, GENITAL
Sperm: NONE SEEN
Trich, Wet Prep: NONE SEEN
WBC, Wet Prep HPF POC: >=10 — AB (ref <10)
Yeast Wet Prep HPF POC: NONE SEEN

## 2021-05-25 MED ORDER — INDOMETHACIN 25 MG PO CAPS
25.0000 mg | ORAL_CAPSULE | Freq: Four times a day (QID) | ORAL | Status: DC
  Filled 2021-05-25 (×4): qty 1

## 2021-05-25 MED ORDER — MAGNESIUM SULFATE BOLUS VIA INFUSION
4.0000 g | Freq: Once | INTRAVENOUS | Status: AC
  Filled 2021-05-25: qty 1000

## 2021-05-25 MED ORDER — MAGNESIUM SULFATE 40 GM/1000ML IV SOLN
2.0000 g/h | INTRAVENOUS | Status: DC
  Administered 2021-05-25: 2 g/h via INTRAVENOUS

## 2021-05-25 MED ORDER — INDOMETHACIN 25 MG PO CAPS
50.0000 mg | ORAL_CAPSULE | Freq: Once | ORAL | Status: AC
  Administered 2021-05-25: 50 mg via ORAL
  Filled 2021-05-25: qty 2

## 2021-05-25 MED ORDER — LACTATED RINGERS IV SOLN: INTRAVENOUS | Status: DC

## 2021-05-25 MED ORDER — MAGNESIUM SULFATE 40 GM/1000ML IV SOLN
INTRAVENOUS | Status: AC
  Administered 2021-05-25: 4 g via INTRAVENOUS
  Filled 2021-05-25: qty 1000

## 2021-05-25 NOTE — Progress Notes (Signed)
Pt reports vag leaking of clear liquid fluid since yesterday at 1600. She did not wear a pad yesterday. Continues to see clear liquid today and is using a pad today. She is concerned that she has a slow leak and wants to be tested.

## 2021-05-25 NOTE — MAU Note (Signed)
...  Elizabeth Terrell is a 31 y.o. at [redacted]w[redacted]d here in MAU reporting: LOF since yesterday around 1600. She reports the fluid is clear/yellow and has continued to trickle out since yesterday. She is wearing a pad. She reports she had an appointment today with MFM so she waited to be evaluated until today. Denies VB. Denies pain. +FM.  PPROM 4/20. She reports she was told the sac sealed itself back up.  Onset of complaint: Yesterday at 1600. Pain score: Denies pain.  FHT: unable to doppler due to wearing a dress. Lab orders placed from triage:  Maryann Alar

## 2021-05-25 NOTE — Progress Notes (Signed)
MFM Note  Elizabeth Terrell was seen due to a dichorionic, diamniotic twin gestation where probable rupture of membranes occurred earlier in her pregnancy.  The membranes were presumed to have resealed as she stopped leaking fluid and there was normal amniotic fluid noted around both fetuses.    Her pregnancy has also been complicated by a shortened/funneled cervix (noted since 19+ weeks) along with maternal obesity and chronic hypertension treated with Procardia.   She received a complete course of antenatal corticosteroids on May 18 to 19, 2023.  The patient reports that she started leaking fluid again yesterday.  IUGR of both twin A and twin B was noted last week.  Fetal movements of both twin A and twin B were noted throughout today's exam.  There still appears to be normal amniotic fluid noted around both fetuses today.  Doppler studies of the umbilical arteries performed today for both twin A and twin B continues to show normal forward flow.  There were no signs of absent or reversed end-diastolic flow noted in either fetus.  As the patient is reporting leakage of fluid, she was sent to the MAU for evaluation of ruptured membranes following today's ultrasound exam.    The patient understands that should rupture of membranes to be confirmed again, she will require inpatient management until delivery.    Should rupture of membranes be ruled out, she will continue outpatient monitoring.  Another ultrasound exam is already scheduled in 1 week.  The patient stated that all of her questions were answered today.  A total of 20 minutes was spent counseling and coordinating the care for this patient.  Greater than 50% of the time was spent in direct face-to-face contact.

## 2021-05-25 NOTE — MAU Provider Note (Addendum)
History     CSN: [id number]  Arrival date and time: 05/25/21 1547   Event Date/Time   First Provider Initiated Contact with Patient 05/25/21 1720      Chief Complaint  Patient presents with   Rupture of Membranes   HPI  Ms.Elizabeth Terrell is a 31 y.o. female G80P1001 Di/Di twins @ [redacted]w[redacted]d here in MAU for evaluation of possible ROM. She has a history of PPROM At 20 weeks confirmed with amnisure and fern positive. She was admitted to the hospital and completed a course of steroids. Her fluid showed normal around 22 weeks and has been normal thus far. She reported to MFM today that she has felt wet on her perineum. She was sent her for further evaluation of PPROM.   Her OB history is complicated by morbid obesity, Di/Di twins, fatty liver disease, CHTN, cervical insufficiency, and PPROM @ 20 weeks.   OB History     Gravida  2   Para  1   Term  1   Preterm      AB      Living  1      SAB      IAB      Ectopic      Multiple      Live Births  1           Past Medical History:  Diagnosis Date   Medical history non-contributory     Past Surgical History:  Procedure Laterality Date   CHOLECYSTECTOMY  2018    Family History  Problem Relation Age of Onset   Glaucoma Mother    Heart disease Father    Hypertension Father     Social History   Tobacco Use   Smoking status: Never   Smokeless tobacco: Never  Vaping Use   Vaping Use: Never used  Substance Use Topics   Alcohol use: Not Currently    Comment: Occas   Drug use: Not Currently    Allergies: No Known Allergies  Medications Prior to Admission  Medication Sig Dispense Refill Last Dose   aspirin 81 MG chewable tablet Chew 81 mg by mouth daily.   05/25/2021   NIFEdipine (PROCARDIA-XL/NIFEDICAL-XL) 30 MG 24 hr tablet Take 30 mg by mouth daily.   05/25/2021   Prenatal Vit-Fe Fumarate-FA (MULTIVITAMIN-PRENATAL) 27-0.8 MG TABS tablet Take 1 tablet by mouth daily at 12 noon.   05/24/2021   doxylamine,  Sleep, (UNISOM SLEEPTABS) 25 MG tablet Take 1 tablet (25 mg total) by mouth at bedtime as needed. (Patient not taking: Reported on 05/19/2021) 30 tablet 1    metroNIDAZOLE (FLAGYL) 500 MG tablet Take 1 tablet (500 mg total) by mouth 2 (two) times daily. (Patient not taking: Reported on 05/25/2021) 14 tablet 0    Results for orders placed or performed during the hospital encounter of 05/25/21 (from the past 48 hour(s))  Wet prep, genital     Status: Abnormal   Collection Time: 05/25/21  5:30 PM  Result Value Ref Range   Yeast Wet Prep HPF POC NONE SEEN NONE SEEN   Trich, Wet Prep NONE SEEN NONE SEEN   Clue Cells Wet Prep HPF POC PRESENT (A) NONE SEEN   WBC, Wet Prep HPF POC >=10 (A) <10   Sperm NONE SEEN     Comment: Performed at [facility], [address], [place], [place] [address]    [place] MFM OB LIMITED  Result Date: 05/25/2021 ----------------------------------------------------------------------  OBSTETRICS REPORT                       (  Signed Final 05/25/2021 05:47 pm) ---------------------------------------------------------------------- Patient Info  ID #:       [id number]                          D.O.B.:  Dec 18, 1990 (30 yrs)  Name:       Elizabeth Terrell                  Visit Date: 05/25/2021 01:55 pm ---------------------------------------------------------------------- Performed By  Attending:        Ma Rings MD         Ref. Address:     [address]                                                             [place], [place]                                                             [address]  Performed By:     Burt Knack RDMS     [facility]  Referred By:      [facility]  ---------------------------------------------------------------------- Orders  #  Description                           Code        Ordered By  1  [place] MFM OB LIMITED                     [id number]    Rosana Hoes ----------------------------------------------------------------------  #  Order #                     Accession #                Episode #  1  [id number]                   [id number]                 [id number] ---------------------------------------------------------------------- Indications  Twin pregnancy, di/di, second trimester        O30.042  Maternal care for known or suspected poor      O36.5921  fetal growth, second trimester, fetus 1 IUGR  Obesity complicating pregnancy, second         O99.212  trimester (BMI 58)  Hypertension - Chronic/Pre-existing            O10.019  (Procardia)  Premature rupture of membranes - leaking       O42.90  fluid (positive Amnisure)  [redacted] weeks gestation of pregnancy                Z3A.24  Cervical insufficiency, 2nd (vaginal           O34.32  progesterone) ---------------------------------------------------------------------- Fetal Evaluation (Fetus A)  Num Of Fetuses:         2  Fetal Heart Rate(bpm):  170  Cardiac Activity:       Observed  Fetal Lie:              Maternal right side  Presentation:           Cephalic  Placenta:               Anterior  P. Cord Insertion:      Previously Visualized                              Largest Pocket(cm)                              3.59 ---------------------------------------------------------------------- OB History  Gravidity:    2         Term:   1        Prem:   0        SAB:   0  TOP:          0       Ectopic:  0        Living: 1 ---------------------------------------------------------------------- Gestational Age (Fetus A)  LMP:           24w 5d        Date:  12/03/20                 EDD:   09/09/21  Best:          24w 5d     Det. By:  LMP  (12/03/20)          EDD:   09/09/21  ---------------------------------------------------------------------- Doppler - Fetal Vessels (Fetus A)  Umbilical Artery   S/D     %tile      RI    %tile      PI    %tile     PSV    ADFV    RDFV                                                     (cm/s)      4  77    0.74       73    1.17       64    54.01      No      No ---------------------------------------------------------------------- Fetal Evaluation (Fetus B)  Num Of Fetuses:         2  Fetal Heart Rate(bpm):  175  Cardiac Activity:       Observed  Fetal Lie:              Maternal left side  Presentation:           Cephalic  Placenta:               Posterior  Amniotic Fluid  AFI FV:      Within normal limits                              Largest Pocket(cm)                              3.17 ---------------------------------------------------------------------- Gestational Age (Fetus B)  LMP:           24w 5d        Date:  12/03/20                 EDD:   09/09/21  Best:          24w 5d     Det. By:  LMP  (12/03/20)          EDD:   09/09/21 ---------------------------------------------------------------------- Doppler - Fetal Vessels (Fetus B)  Umbilical Artery   S/D     %tile      RI    %tile      PI    %tile     PSV    ADFV    RDFV                                                     (cm/s)   4.93       95     0.8       92    1.41       93    40.89      No      No ---------------------------------------------------------------------- Cervix Uterus Adnexa  Cervix  Appears funnelled, see comments. ---------------------------------------------------------------------- Comments  Elizabeth Terrell was seen due to a dichorionic, diamniotic twin  gestation where probable rupture of membranes occurred  earlier in her pregnancy.  The membranes were presumed to  have resealed as she stopped leaking fluid and there was  normal amniotic fluid noted around both fetuses.  Her pregnancy has also been complicated by a  shortened/funneled cervix (noted since 19+ weeks)  along  with maternal obesity and chronic hypertension treated with  Procardia.  She received a complete course of antenatal corticosteroids  on May 18 to 19, 2023.  The patient reports that she started leaking fluid again  yesterday.  IUGR of both twin A and twin B was noted last week.  Fetal movements of both twin A and twin B were noted  throughout today's exam.  There still appears to be normal amniotic fluid noted around  both fetuses today.  Doppler  studies of the umbilical arteries performed today for  both twin A and twin B continues to show normal forward  flow.  There were no signs of absent or reversed end-diastolic  flow noted in either fetus.  As the patient is reporting leakage of fluid, she was sent to  the MAU for evaluation of ruptured membranes following  today's ultrasound exam.  The patient understands that should rupture of membranes to  be confirmed again, she will require inpatient management  until delivery.  Should rupture of membranes be ruled out, she will continue  outpatient monitoring.  Another ultrasound exam is already  scheduled in 1 week.  The patient stated that all of her questions were answered  today.  A total of 20 minutes was spent counseling and coordinating  the care for this patient.  Greater than 50% of the time was  spent in direct face-to-face contact. ----------------------------------------------------------------------                   Ma Rings, MD Electronically Signed Final Report   05/25/2021 05:47 pm ----------------------------------------------------------------------    Review of Systems  Constitutional:  Negative for fever.  Gastrointestinal:  Negative for abdominal pain, nausea and vomiting.  Genitourinary:  Positive for vaginal discharge. Negative for pelvic pain and vaginal bleeding.  Physical Exam   Blood pressure 122/83, pulse (!) 121, temperature 98.9 F (37.2 C), temperature source Oral, resp. rate 17, height 5\' 2"  (1.575 m), weight (!)  145.9 kg, last menstrual period 12/03/2020, SpO2 100 %.  Physical Exam Vitals and nursing note reviewed.  Constitutional:      General: She is not in acute distress.    Appearance: Normal appearance. She is obese. She is not ill-appearing, toxic-appearing or diaphoretic.  HENT:     Head: Normocephalic.  Eyes:     Pupils: Pupils are equal, round, and reactive to light.  Pulmonary:     Effort: Pulmonary effort is normal.  Genitourinary:    Comments: Vagina - Small amount of white vaginal discharge, no odor  Cervix - bulging membranes, no pooling of fluid.  Bimanual exam: Cervix Dilation: 3, 60%, -3, bulging bag palpated.  Exam by:: [email or web address] Jyll Tomaro, NP  GC/Chlam, wet prep done Chaperone present for exam.   Skin:    General: Skin is warm.  Neurological:     Mental Status: She is alert and oriented to person, place, and time.  Psychiatric:        Behavior: Behavior normal.   Fetal Tracing Fetus A Baseline: 150 bpm Variability: Moderate  Accelerations: 15x15 Decelerations: None Toco: None  Fetal Tracing Fetus B Baseline: 140 bpm Variability: Moderate  Accelerations: 15x15 Decelerations: None    MAU Course  Procedures None  MDM  Shela Commons reviewed from MFM today.  Wet prep and GC collected Magnesium started.  Dr. [place] to MAU to discuss patient and plan of care. NICU is at capacity with high acuity, they recommend patient be transferred to another facility with a NICU Prior to transfer the patient's cervix was rechecked with a very gentle cervical exam: cervix unchanged 3/3.5, 60%, -3, membranes in the cervix intact.   Assessment and Plan   A:  1. Preterm labor in second trimester without delivery   2. [redacted] weeks gestation of pregnancy   3. Dichorionic diamniotic twin pregnancy in second trimester   4. Chronic hypertension affecting pregnancy   5. Bacterial vaginosis      P:  Transfer to [facility] per [facility] NICU team's request. Patient is  stable for transfer   Duane Lope, NP 05/25/2021 8:42 PM

## 2021-05-25 NOTE — MAU Note (Signed)
Per Dr. Charlotta Newton RN okay to monitor with toco only during magnesium administration.

## 2021-05-31 ENCOUNTER — Other Ambulatory Visit: Payer: Self-pay | Admitting: Obstetrics

## 2021-05-31 DIAGNOSIS — O30042 Twin pregnancy, dichorionic/diamniotic, second trimester: Secondary | ICD-10-CM

## 2021-05-31 DIAGNOSIS — O42919 Preterm premature rupture of membranes, unspecified as to length of time between rupture and onset of labor, unspecified trimester: Secondary | ICD-10-CM

## 2021-06-01 ENCOUNTER — Other Ambulatory Visit: Payer: Self-pay | Admitting: Obstetrics

## 2021-06-01 ENCOUNTER — Ambulatory Visit: Payer: Medicaid Other | Admitting: *Deleted

## 2021-06-01 ENCOUNTER — Ambulatory Visit: Payer: Medicaid Other | Attending: Obstetrics

## 2021-06-01 VITALS — BP 120/68 | HR 105

## 2021-06-01 DIAGNOSIS — O10013 Pre-existing essential hypertension complicating pregnancy, third trimester: Secondary | ICD-10-CM | POA: Diagnosis not present

## 2021-06-01 DIAGNOSIS — O42913 Preterm premature rupture of membranes, unspecified as to length of time between rupture and onset of labor, third trimester: Secondary | ICD-10-CM | POA: Diagnosis not present

## 2021-06-01 DIAGNOSIS — O42919 Preterm premature rupture of membranes, unspecified as to length of time between rupture and onset of labor, unspecified trimester: Secondary | ICD-10-CM

## 2021-06-01 DIAGNOSIS — O30042 Twin pregnancy, dichorionic/diamniotic, second trimester: Secondary | ICD-10-CM | POA: Diagnosis not present

## 2021-06-01 DIAGNOSIS — O3433 Maternal care for cervical incompetence, third trimester: Secondary | ICD-10-CM

## 2021-06-01 DIAGNOSIS — O30043 Twin pregnancy, dichorionic/diamniotic, third trimester: Secondary | ICD-10-CM

## 2021-06-01 DIAGNOSIS — E669 Obesity, unspecified: Secondary | ICD-10-CM

## 2021-06-01 DIAGNOSIS — O365921 Maternal care for other known or suspected poor fetal growth, second trimester, fetus 1: Secondary | ICD-10-CM

## 2021-06-01 DIAGNOSIS — Z3A25 25 weeks gestation of pregnancy: Secondary | ICD-10-CM

## 2021-06-01 DIAGNOSIS — O99212 Obesity complicating pregnancy, second trimester: Secondary | ICD-10-CM

## 2021-06-02 ENCOUNTER — Other Ambulatory Visit: Payer: Self-pay | Admitting: *Deleted

## 2021-06-02 DIAGNOSIS — O365920 Maternal care for other known or suspected poor fetal growth, second trimester, not applicable or unspecified: Secondary | ICD-10-CM

## 2021-06-02 DIAGNOSIS — O09212 Supervision of pregnancy with history of pre-term labor, second trimester: Secondary | ICD-10-CM

## 2021-06-02 DIAGNOSIS — O30002 Twin pregnancy, unspecified number of placenta and unspecified number of amniotic sacs, second trimester: Secondary | ICD-10-CM

## 2021-06-08 ENCOUNTER — Encounter: Payer: Self-pay | Admitting: *Deleted

## 2021-06-08 ENCOUNTER — Other Ambulatory Visit: Payer: Self-pay | Admitting: Obstetrics

## 2021-06-08 ENCOUNTER — Ambulatory Visit: Payer: Medicaid Other | Admitting: *Deleted

## 2021-06-08 ENCOUNTER — Ambulatory Visit: Payer: Medicaid Other | Attending: Obstetrics

## 2021-06-08 VITALS — BP 128/90 | HR 103

## 2021-06-08 DIAGNOSIS — O42919 Preterm premature rupture of membranes, unspecified as to length of time between rupture and onset of labor, unspecified trimester: Secondary | ICD-10-CM | POA: Diagnosis present

## 2021-06-08 DIAGNOSIS — D696 Thrombocytopenia, unspecified: Secondary | ICD-10-CM

## 2021-06-08 DIAGNOSIS — O30042 Twin pregnancy, dichorionic/diamniotic, second trimester: Secondary | ICD-10-CM | POA: Diagnosis present

## 2021-06-08 DIAGNOSIS — O3432 Maternal care for cervical incompetence, second trimester: Secondary | ICD-10-CM

## 2021-06-08 DIAGNOSIS — O365922 Maternal care for other known or suspected poor fetal growth, second trimester, fetus 2: Secondary | ICD-10-CM

## 2021-06-08 DIAGNOSIS — O10012 Pre-existing essential hypertension complicating pregnancy, second trimester: Secondary | ICD-10-CM | POA: Diagnosis not present

## 2021-06-08 DIAGNOSIS — O42912 Preterm premature rupture of membranes, unspecified as to length of time between rupture and onset of labor, second trimester: Secondary | ICD-10-CM | POA: Diagnosis not present

## 2021-06-08 DIAGNOSIS — Z3A26 26 weeks gestation of pregnancy: Secondary | ICD-10-CM

## 2021-06-08 DIAGNOSIS — E669 Obesity, unspecified: Secondary | ICD-10-CM

## 2021-06-08 DIAGNOSIS — O99212 Obesity complicating pregnancy, second trimester: Secondary | ICD-10-CM

## 2021-06-08 DIAGNOSIS — O365921 Maternal care for other known or suspected poor fetal growth, second trimester, fetus 1: Secondary | ICD-10-CM | POA: Diagnosis not present

## 2021-06-08 DIAGNOSIS — O99119 Other diseases of the blood and blood-forming organs and certain disorders involving the immune mechanism complicating pregnancy, unspecified trimester: Secondary | ICD-10-CM

## 2021-06-12 ENCOUNTER — Encounter: Payer: Self-pay | Admitting: Medical

## 2021-06-12 ENCOUNTER — Ambulatory Visit (INDEPENDENT_AMBULATORY_CARE_PROVIDER_SITE_OTHER): Payer: Medicaid Other | Admitting: Medical

## 2021-06-12 ENCOUNTER — Other Ambulatory Visit: Payer: Self-pay | Admitting: *Deleted

## 2021-06-12 VITALS — BP 128/80 | HR 105 | Wt 321.0 lb

## 2021-06-12 DIAGNOSIS — O30041 Twin pregnancy, dichorionic/diamniotic, first trimester: Secondary | ICD-10-CM

## 2021-06-12 DIAGNOSIS — O10919 Unspecified pre-existing hypertension complicating pregnancy, unspecified trimester: Secondary | ICD-10-CM

## 2021-06-12 DIAGNOSIS — O30042 Twin pregnancy, dichorionic/diamniotic, second trimester: Secondary | ICD-10-CM | POA: Insufficient documentation

## 2021-06-12 DIAGNOSIS — O99210 Obesity complicating pregnancy, unspecified trimester: Secondary | ICD-10-CM

## 2021-06-12 DIAGNOSIS — O365920 Maternal care for other known or suspected poor fetal growth, second trimester, not applicable or unspecified: Secondary | ICD-10-CM

## 2021-06-12 DIAGNOSIS — O0990 Supervision of high risk pregnancy, unspecified, unspecified trimester: Secondary | ICD-10-CM

## 2021-06-12 DIAGNOSIS — O99112 Other diseases of the blood and blood-forming organs and certain disorders involving the immune mechanism complicating pregnancy, second trimester: Secondary | ICD-10-CM | POA: Diagnosis not present

## 2021-06-12 DIAGNOSIS — O42919 Preterm premature rupture of membranes, unspecified as to length of time between rupture and onset of labor, unspecified trimester: Secondary | ICD-10-CM | POA: Diagnosis not present

## 2021-06-12 DIAGNOSIS — Z3A27 27 weeks gestation of pregnancy: Secondary | ICD-10-CM

## 2021-06-12 DIAGNOSIS — D696 Thrombocytopenia, unspecified: Secondary | ICD-10-CM

## 2021-06-12 DIAGNOSIS — O365990 Maternal care for other known or suspected poor fetal growth, unspecified trimester, not applicable or unspecified: Secondary | ICD-10-CM | POA: Insufficient documentation

## 2021-06-12 MED ORDER — VITAFOL FE+ 90-0.6-0.4-200 MG PO CAPS: 1.0000 | ORAL_CAPSULE | Freq: Every day | ORAL | 12 refills | Status: AC

## 2021-06-12 NOTE — Progress Notes (Signed)
Pt presents for ROB requests cx check today.  She reports 4 cm last visit.  Needs rf on PNV's caps

## 2021-06-12 NOTE — Progress Notes (Signed)
PRENATAL VISIT NOTE  Subjective:  Elizabeth Terrell is a 31 y.o. G2P1001 at [redacted]w[redacted]d being seen today for ongoing prenatal care.  She is currently monitored for the following issues for this high-risk pregnancy and has Preterm premature rupture of membranes (PPROM) with unknown onset of labor; Chronic hypertension affecting pregnancy; Obesity in pregnancy; Transaminitis; Hypokalemia; Gestational thrombocytopenia (HCC); Dichorionic diamniotic twin pregnancy in second trimester; and Intrauterine growth restriction (IUGR) affecting care of mother on their problem list.  Patient reports no complaints.  Contractions: Not present. Vag. Bleeding: None.  Movement: Present. Denies leaking of fluid.   The following portions of the patient's history were reviewed and updated as appropriate: allergies, current medications, past family history, past medical history, past social history, past surgical history and problem list.   Objective:   Vitals:   06/12/21 1113  BP: 128/80  Pulse: (!) 105  Weight: (!) 321 lb (145.6 kg)    Fetal Status: Fetal Heart Rate (bpm): A148 / B152   Movement: Present     General:  Alert, oriented and cooperative. Patient is in no acute distress.  Skin: Skin is warm and dry. No rash noted.   Cardiovascular: Normal heart rate noted  Respiratory: Normal respiratory effort, no problems with respiration noted  Abdomen: Soft, gravid, appropriate for gestational age.  Pain/Pressure: Absent     Pelvic: Cervical exam performed in the presence of a chaperone       4cm/50%/-3  Extremities: Normal range of motion.  Edema: None  Mental Status: Normal mood and affect. Normal behavior. Normal judgment and thought content.   Assessment and Plan:  Pregnancy: G2P1001 at [redacted]w[redacted]d 1. Supervision of high risk pregnancy, antepartum - Prenat-Fe Poly-Methfol-FA-DHA (VITAFOL FE+) 90-0.6-0.4-200 MG CAPS; Take 1 capsule by mouth daily.  Dispense: 30 capsule; Refill: 12  2. Preterm premature rupture  of membranes (PPROM) with unknown onset of labor - Per MFM, appears to have re-sealed, normal AFI recently   3. Benign gestational thrombocytopenia in second trimester (HCC) - CBC today   4. Chronic hypertension affecting pregnancy - Basic Metabolic Panel (BMET) - Continue procardia and ASA  - normotensive today   5. Obesity in pregnancy  6. [redacted] weeks gestation of pregnancy  7. Dichorionic diamniotic twin pregnancy in second trimester - MFM monitoring weekly   8. Poor fetal growth affecting management of mother in second trimester, single or unspecified fetus - Last US showed Baby A 8%tile and Baby B 3%til - Continue to follow-up with MFM weekly - Discussed delivery timing based on fetal growth and twin gestation   Preterm labor symptoms and general obstetric precautions including but not limited to vaginal bleeding, contractions, leaking of fluid and fetal movement were reviewed in detail with the patient. Please refer to After Visit Summary for other counseling recommendations.   Return in about 1 week (around 06/19/2021) for [facility] MD only, In-Person, 28 week labs (fasting).  Future Appointments  Date Time Provider Department Center  06/15/2021 10:30 AM [facility] NURSE [facility]  06/15/2021 10:45 AM [facility] NST [facility]  06/15/2021 11:45 AM [facility] US7 [facility]  06/21/2021  8:20 AM [facility] LAB [facility]  06/21/2021  8:55 AM Adam Phenix, MD [facility]  06/21/2021  9:30 AM [facility] NURSE [facility]  06/21/2021  9:45 AM [facility] US7 [facility]  06/21/2021 10:45 AM [facility] NST [facility]  06/28/2021 12:30 PM [facility] NURSE [facility]  06/28/2021 12:45 PM [facility] US4 [facility]  06/28/2021  2:15 PM [facility] NST [facility]  Vonzella Nipple, PA-C

## 2021-06-13 LAB — CBC
Hematocrit: 33 % — ABNORMAL LOW (ref 34.0–46.6)
Hemoglobin: 10.7 g/dL — ABNORMAL LOW (ref 11.1–15.9)
MCH: 30.4 pg (ref 26.6–33.0)
MCHC: 32.4 g/dL (ref 31.5–35.7)
MCV: 94 fL (ref 79–97)
Platelets: 232 10*3/uL (ref 150–450)
RBC: 3.52 x10E6/uL — ABNORMAL LOW (ref 3.77–5.28)
RDW: 13.3 % (ref 11.7–15.4)
WBC: 9.8 10*3/uL (ref 3.4–10.8)

## 2021-06-13 LAB — BASIC METABOLIC PANEL WITH GFR
BUN/Creatinine Ratio: 9 (ref 9–23)
BUN: 4 mg/dL — ABNORMAL LOW (ref 6–20)
CO2: 19 mmol/L — ABNORMAL LOW (ref 20–29)
Calcium: 9.3 mg/dL (ref 8.7–10.2)
Chloride: 101 mmol/L (ref 96–106)
Creatinine, Ser: 0.47 mg/dL — ABNORMAL LOW (ref 0.57–1.00)
Glucose: 88 mg/dL (ref 70–99)
Potassium: 3.7 mmol/L (ref 3.5–5.2)
Sodium: 138 mmol/L (ref 134–144)
eGFR: 130 mL/min/1.73

## 2021-06-15 ENCOUNTER — Other Ambulatory Visit: Payer: Self-pay

## 2021-06-15 ENCOUNTER — Ambulatory Visit: Payer: Medicaid Other

## 2021-06-15 ENCOUNTER — Inpatient Hospital Stay (HOSPITAL_COMMUNITY): Payer: Medicaid Other | Admitting: Anesthesiology

## 2021-06-15 ENCOUNTER — Encounter (HOSPITAL_COMMUNITY)
Admission: AD | Disposition: A | Discharge status: Home / Self Care | Payer: Self-pay | Source: Home / Self Care | Attending: Obstetrics & Gynecology

## 2021-06-15 ENCOUNTER — Inpatient Hospital Stay (HOSPITAL_COMMUNITY)
Admission: AD | Admit: 2021-06-15 | Discharge: 2021-06-18 | DRG: 786 | Disposition: A | Discharge status: Home / Self Care | Payer: Medicaid Other | Attending: Obstetrics and Gynecology | Admitting: Obstetrics and Gynecology

## 2021-06-15 ENCOUNTER — Encounter (HOSPITAL_COMMUNITY): Payer: Self-pay | Admitting: Family Medicine

## 2021-06-15 DIAGNOSIS — O42912 Preterm premature rupture of membranes, unspecified as to length of time between rupture and onset of labor, second trimester: Principal | ICD-10-CM | POA: Diagnosis present

## 2021-06-15 DIAGNOSIS — O1092 Unspecified pre-existing hypertension complicating childbirth: Secondary | ICD-10-CM

## 2021-06-15 DIAGNOSIS — O1002 Pre-existing essential hypertension complicating childbirth: Secondary | ICD-10-CM | POA: Diagnosis present

## 2021-06-15 DIAGNOSIS — Z9049 Acquired absence of other specified parts of digestive tract: Secondary | ICD-10-CM

## 2021-06-15 DIAGNOSIS — O10919 Unspecified pre-existing hypertension complicating pregnancy, unspecified trimester: Secondary | ICD-10-CM | POA: Diagnosis present

## 2021-06-15 DIAGNOSIS — Z3490 Encounter for supervision of normal pregnancy, unspecified, unspecified trimester: Secondary | ICD-10-CM

## 2021-06-15 DIAGNOSIS — O365921 Maternal care for other known or suspected poor fetal growth, second trimester, fetus 1: Secondary | ICD-10-CM | POA: Diagnosis present

## 2021-06-15 DIAGNOSIS — O99210 Obesity complicating pregnancy, unspecified trimester: Secondary | ICD-10-CM | POA: Diagnosis present

## 2021-06-15 DIAGNOSIS — O365920 Maternal care for other known or suspected poor fetal growth, second trimester, not applicable or unspecified: Secondary | ICD-10-CM

## 2021-06-15 DIAGNOSIS — O30042 Twin pregnancy, dichorionic/diamniotic, second trimester: Secondary | ICD-10-CM

## 2021-06-15 DIAGNOSIS — O99214 Obesity complicating childbirth: Secondary | ICD-10-CM | POA: Diagnosis present

## 2021-06-15 DIAGNOSIS — O321XX2 Maternal care for breech presentation, fetus 2: Secondary | ICD-10-CM | POA: Diagnosis present

## 2021-06-15 DIAGNOSIS — O30043 Twin pregnancy, dichorionic/diamniotic, third trimester: Secondary | ICD-10-CM | POA: Diagnosis not present

## 2021-06-15 DIAGNOSIS — O365930 Maternal care for other known or suspected poor fetal growth, third trimester, not applicable or unspecified: Secondary | ICD-10-CM | POA: Diagnosis not present

## 2021-06-15 DIAGNOSIS — Z7982 Long term (current) use of aspirin: Secondary | ICD-10-CM | POA: Diagnosis not present

## 2021-06-15 DIAGNOSIS — O365922 Maternal care for other known or suspected poor fetal growth, second trimester, fetus 2: Secondary | ICD-10-CM | POA: Diagnosis present

## 2021-06-15 DIAGNOSIS — D6959 Other secondary thrombocytopenia: Secondary | ICD-10-CM | POA: Diagnosis present

## 2021-06-15 DIAGNOSIS — Z3A27 27 weeks gestation of pregnancy: Secondary | ICD-10-CM

## 2021-06-15 DIAGNOSIS — O9912 Other diseases of the blood and blood-forming organs and certain disorders involving the immune mechanism complicating childbirth: Secondary | ICD-10-CM | POA: Diagnosis present

## 2021-06-15 DIAGNOSIS — O42919 Preterm premature rupture of membranes, unspecified as to length of time between rupture and onset of labor, unspecified trimester: Secondary | ICD-10-CM | POA: Diagnosis present

## 2021-06-15 LAB — CBC
HCT: 32.4 % — ABNORMAL LOW (ref 36.0–46.0)
Hemoglobin: 11 g/dL — ABNORMAL LOW (ref 12.0–15.0)
MCH: 31.6 pg (ref 26.0–34.0)
MCHC: 34 g/dL (ref 30.0–36.0)
MCV: 93.1 fL (ref 80.0–100.0)
Platelets: 238 10*3/uL (ref 150–400)
RBC: 3.48 MIL/uL — ABNORMAL LOW (ref 3.87–5.11)
RDW: 13.7 % (ref 11.5–15.5)
WBC: 10.3 10*3/uL (ref 4.0–10.5)
nRBC: 0 % (ref 0.0–0.2)

## 2021-06-15 LAB — TYPE AND SCREEN
ABO/RH(D): A POS
Antibody Screen: NEGATIVE

## 2021-06-15 SURGERY — Surgical Case
Anesthesia: General | Site: Abdomen

## 2021-06-15 MED ORDER — DIPHENHYDRAMINE HCL 25 MG PO CAPS: 25.0000 mg | ORAL_CAPSULE | Freq: Four times a day (QID) | ORAL | Status: DC | PRN

## 2021-06-15 MED ORDER — DIPHENHYDRAMINE HCL 12.5 MG/5ML PO ELIX: 12.5000 mg | ORAL_SOLUTION | Freq: Four times a day (QID) | ORAL | Status: DC | PRN

## 2021-06-15 MED ORDER — TRANEXAMIC ACID-NACL 1000-0.7 MG/100ML-% IV SOLN
INTRAVENOUS | Status: AC
  Filled 2021-06-15: qty 100

## 2021-06-15 MED ORDER — OXYCODONE HCL 5 MG PO TABS: 5.0000 mg | ORAL_TABLET | Freq: Once | ORAL | Status: DC | PRN

## 2021-06-15 MED ORDER — CEFAZOLIN IN SODIUM CHLORIDE 3-0.9 GM/100ML-% IV SOLN
INTRAVENOUS | Status: AC
  Filled 2021-06-15: qty 100

## 2021-06-15 MED ORDER — ONDANSETRON HCL 4 MG/2ML IJ SOLN
INTRAMUSCULAR | Status: DC | PRN
  Administered 2021-06-15: 4 mg via INTRAVENOUS

## 2021-06-15 MED ORDER — OXYTOCIN-SODIUM CHLORIDE 30-0.9 UT/500ML-% IV SOLN: 2.5000 [IU]/h | INTRAVENOUS | Status: AC

## 2021-06-15 MED ORDER — MIDAZOLAM HCL 2 MG/2ML IJ SOLN
INTRAMUSCULAR | Status: DC | PRN
  Administered 2021-06-15: 2 mg via INTRAVENOUS

## 2021-06-15 MED ORDER — SUGAMMADEX SODIUM 200 MG/2ML IV SOLN
INTRAVENOUS | Status: DC | PRN
  Administered 2021-06-15: 200 mg via INTRAVENOUS

## 2021-06-15 MED ORDER — ONDANSETRON HCL 4 MG/2ML IJ SOLN: 4.0000 mg | Freq: Four times a day (QID) | INTRAMUSCULAR | Status: DC | PRN

## 2021-06-15 MED ORDER — MEPERIDINE HCL 25 MG/ML IJ SOLN: 6.2500 mg | INTRAMUSCULAR | Status: DC | PRN

## 2021-06-15 MED ORDER — MIDAZOLAM HCL 2 MG/2ML IJ SOLN
INTRAMUSCULAR | Status: AC
  Filled 2021-06-15: qty 2

## 2021-06-15 MED ORDER — OXYCODONE-ACETAMINOPHEN 5-325 MG PO TABS: 1.0000 | ORAL_TABLET | ORAL | Status: DC | PRN

## 2021-06-15 MED ORDER — ACETAMINOPHEN 160 MG/5ML PO SOLN: 325.0000 mg | ORAL | Status: DC | PRN

## 2021-06-15 MED ORDER — ACETAMINOPHEN 325 MG PO TABS: 325.0000 mg | ORAL_TABLET | ORAL | Status: DC | PRN

## 2021-06-15 MED ORDER — DIBUCAINE (PERIANAL) 1 % EX OINT: TOPICAL_OINTMENT | CUTANEOUS | Status: DC | PRN

## 2021-06-15 MED ORDER — OXYCODONE-ACETAMINOPHEN 5-325 MG PO TABS: 2.0000 | ORAL_TABLET | ORAL | Status: DC | PRN

## 2021-06-15 MED ORDER — LIDOCAINE HCL (PF) 1 % IJ SOLN: 30.0000 mL | INTRAMUSCULAR | Status: DC | PRN

## 2021-06-15 MED ORDER — LACTATED RINGERS IV SOLN: INTRAVENOUS | Status: DC

## 2021-06-15 MED ORDER — SODIUM CHLORIDE 0.9% FLUSH
9.0000 mL | INTRAVENOUS | Status: DC | PRN
  Administered 2021-06-17: 9 mL via INTRAVENOUS

## 2021-06-15 MED ORDER — MENTHOL 3 MG MT LOZG: 1.0000 | LOZENGE | OROMUCOSAL | Status: DC | PRN

## 2021-06-15 MED ORDER — ONDANSETRON HCL 4 MG/2ML IJ SOLN: 4.0000 mg | Freq: Once | INTRAMUSCULAR | Status: DC | PRN

## 2021-06-15 MED ORDER — ENOXAPARIN SODIUM 80 MG/0.8ML IJ SOSY
80.0000 mg | PREFILLED_SYRINGE | INTRAMUSCULAR | Status: DC
  Administered 2021-06-16 – 2021-06-17 (×2): 80 mg via SUBCUTANEOUS
  Filled 2021-06-15 (×2): qty 0.8

## 2021-06-15 MED ORDER — SENNOSIDES-DOCUSATE SODIUM 8.6-50 MG PO TABS
2.0000 | ORAL_TABLET | Freq: Every day | ORAL | Status: DC
  Administered 2021-06-16 – 2021-06-18 (×3): 2 via ORAL
  Filled 2021-06-15 (×3): qty 2

## 2021-06-15 MED ORDER — SIMETHICONE 80 MG PO CHEW
80.0000 mg | CHEWABLE_TABLET | Freq: Three times a day (TID) | ORAL | Status: DC
  Administered 2021-06-15 – 2021-06-18 (×8): 80 mg via ORAL
  Filled 2021-06-15 (×8): qty 1

## 2021-06-15 MED ORDER — FUROSEMIDE 20 MG PO TABS
20.0000 mg | ORAL_TABLET | Freq: Every day | ORAL | Status: DC
  Administered 2021-06-16 – 2021-06-18 (×3): 20 mg via ORAL
  Filled 2021-06-15 (×3): qty 1

## 2021-06-15 MED ORDER — SUCCINYLCHOLINE CHLORIDE 200 MG/10ML IV SOSY
PREFILLED_SYRINGE | INTRAVENOUS | Status: AC
  Filled 2021-06-15: qty 10

## 2021-06-15 MED ORDER — OXYTOCIN-SODIUM CHLORIDE 30-0.9 UT/500ML-% IV SOLN
INTRAVENOUS | Status: DC | PRN
  Administered 2021-06-15: 40 mL via INTRAVENOUS

## 2021-06-15 MED ORDER — FENTANYL CITRATE (PF) 250 MCG/5ML IJ SOLN
INTRAMUSCULAR | Status: AC
  Filled 2021-06-15: qty 5

## 2021-06-15 MED ORDER — FENTANYL CITRATE (PF) 250 MCG/5ML IJ SOLN
INTRAMUSCULAR | Status: DC | PRN
  Administered 2021-06-15: 150 ug via INTRAVENOUS
  Administered 2021-06-15: 50 ug via INTRAVENOUS
  Administered 2021-06-15: 100 ug via INTRAVENOUS
  Administered 2021-06-15 (×3): 50 ug via INTRAVENOUS

## 2021-06-15 MED ORDER — COCONUT OIL OIL
TOPICAL_OIL | Status: DC | PRN
Start: 2021-06-15 — End: 2021-06-18

## 2021-06-15 MED ORDER — SUCCINYLCHOLINE CHLORIDE 200 MG/10ML IV SOSY
PREFILLED_SYRINGE | INTRAVENOUS | Status: DC | PRN
  Administered 2021-06-15: 190 mg via INTRAVENOUS

## 2021-06-15 MED ORDER — PROPOFOL 10 MG/ML IV BOLUS
INTRAVENOUS | Status: DC | PRN
  Administered 2021-06-15: 200 mg via INTRAVENOUS

## 2021-06-15 MED ORDER — ONDANSETRON HCL 4 MG/2ML IJ SOLN
INTRAMUSCULAR | Status: AC
  Filled 2021-06-15: qty 2

## 2021-06-15 MED ORDER — WITCH HAZEL-GLYCERIN EX PADS: MEDICATED_PAD | CUTANEOUS | Status: DC | PRN

## 2021-06-15 MED ORDER — SODIUM CHLORIDE 0.9 % IV SOLN
INTRAVENOUS | Status: DC | PRN
  Administered 2021-06-15: 500 mg via INTRAVENOUS

## 2021-06-15 MED ORDER — PROPOFOL 10 MG/ML IV BOLUS
INTRAVENOUS | Status: AC
  Filled 2021-06-15: qty 20

## 2021-06-15 MED ORDER — LACTATED RINGERS IV SOLN: INTRAVENOUS | Status: DC | PRN

## 2021-06-15 MED ORDER — PRENATAL MULTIVITAMIN CH
1.0000 | ORAL_TABLET | Freq: Every day | ORAL | Status: DC
  Administered 2021-06-15 – 2021-06-17 (×3): 1 via ORAL
  Filled 2021-06-15 (×3): qty 1

## 2021-06-15 MED ORDER — CEFAZOLIN SODIUM-DEXTROSE 2-4 GM/100ML-% IV SOLN
INTRAVENOUS | Status: AC
  Filled 2021-06-15: qty 100

## 2021-06-15 MED ORDER — SODIUM CHLORIDE 0.9 % IV SOLN
2.0000 g | Freq: Once | INTRAVENOUS | Status: AC
  Administered 2021-06-15: 2 g via INTRAVENOUS

## 2021-06-15 MED ORDER — ONDANSETRON HCL 4 MG/2ML IJ SOLN
4.0000 mg | Freq: Four times a day (QID) | INTRAMUSCULAR | Status: DC | PRN
  Administered 2021-06-15: 4 mg via INTRAVENOUS
  Filled 2021-06-15: qty 2

## 2021-06-15 MED ORDER — SODIUM CHLORIDE 0.9 % IV SOLN
INTRAVENOUS | Status: AC
  Filled 2021-06-15: qty 5

## 2021-06-15 MED ORDER — FENTANYL CITRATE (PF) 100 MCG/2ML IJ SOLN
INTRAMUSCULAR | Status: AC
  Filled 2021-06-15: qty 2

## 2021-06-15 MED ORDER — SODIUM CHLORIDE 0.9 % IR SOLN
Status: DC | PRN
  Administered 2021-06-15: 1000 mL

## 2021-06-15 MED ORDER — KETOROLAC TROMETHAMINE 30 MG/ML IJ SOLN
30.0000 mg | Freq: Four times a day (QID) | INTRAMUSCULAR | Status: AC
  Administered 2021-06-15 – 2021-06-16 (×3): 30 mg via INTRAVENOUS
  Filled 2021-06-15 (×3): qty 1

## 2021-06-15 MED ORDER — IBUPROFEN 600 MG PO TABS
600.0000 mg | ORAL_TABLET | Freq: Four times a day (QID) | ORAL | Status: DC
  Administered 2021-06-16 – 2021-06-18 (×7): 600 mg via ORAL
  Filled 2021-06-15 (×8): qty 1

## 2021-06-15 MED ORDER — ROCURONIUM BROMIDE 100 MG/10ML IV SOLN
INTRAVENOUS | Status: DC | PRN
  Administered 2021-06-15: 10 mg via INTRAVENOUS

## 2021-06-15 MED ORDER — OXYTOCIN-SODIUM CHLORIDE 30-0.9 UT/500ML-% IV SOLN
INTRAVENOUS | Status: AC
  Filled 2021-06-15: qty 500

## 2021-06-15 MED ORDER — ACETAMINOPHEN 325 MG PO TABS
650.0000 mg | ORAL_TABLET | ORAL | Status: DC | PRN
Start: 2021-06-15 — End: 2021-06-15

## 2021-06-15 MED ORDER — DEXTROSE 5 % IV SOLN
INTRAVENOUS | Status: DC | PRN
  Administered 2021-06-15: 3 g via INTRAVENOUS

## 2021-06-15 MED ORDER — OXYTOCIN BOLUS FROM INFUSION: 333.0000 mL | Freq: Once | INTRAVENOUS | Status: DC

## 2021-06-15 MED ORDER — SOD CITRATE-CITRIC ACID 500-334 MG/5ML PO SOLN
30.0000 mL | ORAL | Status: DC | PRN
  Administered 2021-06-15: 30 mL via ORAL
  Filled 2021-06-15: qty 30

## 2021-06-15 MED ORDER — SODIUM CHLORIDE 0.9 % IV SOLN: 1.0000 g | INTRAVENOUS | Status: DC

## 2021-06-15 MED ORDER — ROCURONIUM BROMIDE 10 MG/ML (PF) SYRINGE
PREFILLED_SYRINGE | INTRAVENOUS | Status: AC
  Filled 2021-06-15: qty 10

## 2021-06-15 MED ORDER — SIMETHICONE 80 MG PO CHEW: 80.0000 mg | CHEWABLE_TABLET | ORAL | Status: DC | PRN

## 2021-06-15 MED ORDER — OXYCODONE HCL 5 MG/5ML PO SOLN: 5.0000 mg | Freq: Once | ORAL | Status: DC | PRN

## 2021-06-15 MED ORDER — NALOXONE HCL 0.4 MG/ML IJ SOLN: 0.4000 mg | INTRAMUSCULAR | Status: DC | PRN

## 2021-06-15 MED ORDER — ACETAMINOPHEN 500 MG PO TABS
1000.0000 mg | ORAL_TABLET | Freq: Four times a day (QID) | ORAL | Status: DC
  Administered 2021-06-15 – 2021-06-18 (×10): 1000 mg via ORAL
  Filled 2021-06-15 (×12): qty 2

## 2021-06-15 MED ORDER — DEXAMETHASONE SODIUM PHOSPHATE 4 MG/ML IJ SOLN
INTRAMUSCULAR | Status: AC
  Filled 2021-06-15: qty 1

## 2021-06-15 MED ORDER — ZOLPIDEM TARTRATE 5 MG PO TABS: 5.0000 mg | ORAL_TABLET | Freq: Every evening | ORAL | Status: DC | PRN

## 2021-06-15 MED ORDER — OXYTOCIN-SODIUM CHLORIDE 30-0.9 UT/500ML-% IV SOLN: 2.5000 [IU]/h | INTRAVENOUS | Status: DC

## 2021-06-15 MED ORDER — TETANUS-DIPHTH-ACELL PERTUSSIS 5-2.5-18.5 LF-MCG/0.5 IM SUSY: 0.5000 mL | PREFILLED_SYRINGE | Freq: Once | INTRAMUSCULAR | Status: DC

## 2021-06-15 MED ORDER — ACETAMINOPHEN 10 MG/ML IV SOLN
INTRAVENOUS | Status: DC | PRN
  Administered 2021-06-15: 1000 mg via INTRAVENOUS

## 2021-06-15 MED ORDER — OXYCODONE HCL 5 MG PO TABS
5.0000 mg | ORAL_TABLET | ORAL | Status: DC | PRN
  Administered 2021-06-15 – 2021-06-17 (×2): 10 mg via ORAL
  Administered 2021-06-18: 5 mg via ORAL
  Filled 2021-06-15: qty 1
  Filled 2021-06-15 (×2): qty 2

## 2021-06-15 MED ORDER — FENTANYL CITRATE (PF) 100 MCG/2ML IJ SOLN
25.0000 ug | INTRAMUSCULAR | Status: DC | PRN
  Administered 2021-06-15 (×2): 50 ug via INTRAVENOUS

## 2021-06-15 MED ORDER — NIFEDIPINE ER OSMOTIC RELEASE 30 MG PO TB24
30.0000 mg | ORAL_TABLET | Freq: Every day | ORAL | Status: DC
  Administered 2021-06-16 – 2021-06-17 (×2): 30 mg via ORAL
  Filled 2021-06-15 (×2): qty 1

## 2021-06-15 MED ORDER — DIPHENHYDRAMINE HCL 50 MG/ML IJ SOLN: 12.5000 mg | Freq: Four times a day (QID) | INTRAMUSCULAR | Status: DC | PRN

## 2021-06-15 MED ORDER — DEXAMETHASONE SODIUM PHOSPHATE 10 MG/ML IJ SOLN
INTRAMUSCULAR | Status: DC | PRN
  Administered 2021-06-15: 4 mg via INTRAVENOUS

## 2021-06-15 MED ORDER — TRANEXAMIC ACID-NACL 1000-0.7 MG/100ML-% IV SOLN: 1000.0000 mg | INTRAVENOUS | Status: AC

## 2021-06-15 MED ORDER — ACETAMINOPHEN 10 MG/ML IV SOLN
INTRAVENOUS | Status: AC
  Filled 2021-06-15: qty 100

## 2021-06-15 MED ORDER — LACTATED RINGERS IV SOLN: 500.0000 mL | INTRAVENOUS | Status: DC | PRN

## 2021-06-15 SURGICAL SUPPLY — 35 items
BENZOIN TINCTURE PRP APPL 2/3 (GAUZE/BANDAGES/DRESSINGS) ×1 IMPLANT
CHLORAPREP W/TINT 26ML (MISCELLANEOUS) ×4 IMPLANT
CLAMP CORD UMBIL (MISCELLANEOUS) ×2 IMPLANT
CLOTH BEACON ORANGE TIMEOUT ST (SAFETY) ×2 IMPLANT
DRSG OPSITE POSTOP 4X10 (GAUZE/BANDAGES/DRESSINGS) ×2 IMPLANT
ELECT REM PT RETURN 9FT ADLT (ELECTROSURGICAL) ×2
ELECTRODE REM PT RTRN 9FT ADLT (ELECTROSURGICAL) ×1 IMPLANT
EXTRACTOR VACUUM BELL STYLE (SUCTIONS) IMPLANT
GLOVE BIOGEL PI IND STRL 7.0 (GLOVE) ×2 IMPLANT
GLOVE BIOGEL PI INDICATOR 7.0 (GLOVE) ×2
GLOVE ECLIPSE 7.0 STRL STRAW (GLOVE) ×2 IMPLANT
GOWN STRL REUS W/TWL LRG LVL3 (GOWN DISPOSABLE) ×4 IMPLANT
KIT ABG SYR 3ML LUER SLIP (SYRINGE) ×2 IMPLANT
MAT PREVALON FULL STRYKER (MISCELLANEOUS) ×1 IMPLANT
NDL HYPO 25X5/8 SAFETYGLIDE (NEEDLE) ×1 IMPLANT
NEEDLE HYPO 25X5/8 SAFETYGLIDE (NEEDLE) ×2 IMPLANT
NS IRRIG 1000ML POUR BTL (IV SOLUTION) ×3 IMPLANT
PACK C SECTION WH (CUSTOM PROCEDURE TRAY) ×2 IMPLANT
PAD OB MATERNITY 4.3X12.25 (PERSONAL CARE ITEMS) ×2 IMPLANT
RTRCTR C-SECT PINK 25CM LRG (MISCELLANEOUS) ×2 IMPLANT
STRIP CLOSURE SKIN 1/2X4 (GAUZE/BANDAGES/DRESSINGS) ×1 IMPLANT
SUT MNCRL 0 VIOLET CTX 36 (SUTURE) ×2 IMPLANT
SUT MONOCRYL 0 CTX 36 (SUTURE) ×2
SUT PLAIN 0 NONE (SUTURE) IMPLANT
SUT PLAIN 2 0 (SUTURE)
SUT PLAIN 2 0 XLH (SUTURE) IMPLANT
SUT PLAIN ABS 2-0 CT1 27XMFL (SUTURE) IMPLANT
SUT VIC AB 0 CTX 36 (SUTURE) ×1
SUT VIC AB 0 CTX36XBRD ANBCTRL (SUTURE) ×1 IMPLANT
SUT VIC AB 2-0 CT1 27 (SUTURE)
SUT VIC AB 2-0 CT1 TAPERPNT 27 (SUTURE) IMPLANT
SUT VIC AB 4-0 KS 27 (SUTURE) ×2 IMPLANT
TOWEL OR 17X24 6PK STRL BLUE (TOWEL DISPOSABLE) ×2 IMPLANT
TRAY FOLEY W/BAG SLVR 14FR LF (SET/KITS/TRAYS/PACK) IMPLANT
WATER STERILE IRR 1000ML POUR (IV SOLUTION) ×2 IMPLANT

## 2021-06-15 NOTE — Anesthesia Procedure Notes (Signed)
Procedure Name: Intubation Date/Time: 06/15/2021 10:38 AM  Performed by: Algis Greenhouse, CRNAPre-anesthesia Checklist: Patient identified, Emergency Drugs available, Suction available and Patient being monitored Patient Re-evaluated:Patient Re-evaluated prior to induction Oxygen Delivery Method: Circle system utilized Preoxygenation: Pre-oxygenation with 100% oxygen Induction Type: IV induction and Cricoid Pressure applied Ventilation: Mask ventilation without difficulty Laryngoscope Size: Glidescope and 4 (Lopro) Grade View: Grade I Tube type: Oral Tube size: 7.0 mm Number of attempts: 1 Airway Equipment and Method: Rigid stylet and Video-laryngoscopy Placement Confirmation: ETT inserted through vocal cords under direct vision, positive ETCO2 and breath sounds checked- equal and bilateral Secured at: 21 cm Tube secured with: Tape Dental Injury: Teeth and Oropharynx as per pre-operative assessment

## 2021-06-15 NOTE — Op Note (Signed)
PreOp Diagnosis: 1) Intrauterine pregnancy @ [redacted]w[redacted]d 2) Di/Di twins 3) Fetal malpresentation with cord prolapse 4) chronic HTN 5) obesity  PostOp Diagnosis: same Procedure: Primary mid-uterine transverse C-section  Surgeon: Dr. Myna Hidalgo Assistant: Dr. Warner Mccreedy and Dr. Macon Large Anesthesia: general Complications: none EBL: 700cc UOP: 100cc Fluids: 1500cc  INDICATIONS: Following delivery of Twin A, Twin B noted to be in transverse position.  Attempts were made to manually rotate the fetus to vertex presentation without success.  Findings: Anterior placenta with Twin B in breech presentation.  PROCEDURE:  Informed consent was obtained from the patient with risks, benefits, complications, treatment options, and expected outcomes discussed with the patient.  The patient concurred with the proposed plan, giving informed consent with form signed.   The patient was taken to Operating Room, and identified with the procedure verified as C-Section Delivery with Time Out. With induction of anesthesia, the patient was prepped and draped in the usual sterile fashion. A Pfannenstiel incision was made and carried down through the subcutaneous tissue to the fascia. The fascia was incised in the midline and extended transversely.  The peritoneum was identified and entered. Bladder blade was placed.   A low transverse uterine incision was made.  The placenta was present at the hysterotomy and the baby was delivered from breech presentation.  The head was flexed and delivered without complications. After, the umbilical cord was clamped and cut cord blood was obtained for evaluation.  Since Twin A had previously delivered vaginally, the cord was clamped, sutured ligated and cut with plans to delivery vaginally.  The placenta was then removed intact and appeared normal. Alexis retractor was placed.  The uterine incision was closed with running locked sutures of 0 Vicryl and a second layer of the same stitch was  used in an imbricating fashion.  Excellent hemostasis was obtained. The uterine outline, tubes and ovaries appeared normal.  Upon examination of the uterus, it became evident that the incision was not in the lower uterine segment, but rather in the mid-body of the uterus.  Alexis retractor was removed.  Peritoneum was closed in a running fashion. The fascia was then reapproximated with running sutures of 0 Vicryl. The subcutaneous tissue was reapproximated with 2-0 plain gut suture.  The skin was closed with 4-0 vicryl in a subcuticular fashion.  Instrument, sponge, and needle counts were correct prior the abdominal closure and at the conclusion of the case. The patient was taken to recovery in stable condition.   Due to the location of the uterine incision, I would not recommend TOLAC for a future pregnancy.  Myna Hidalgo, DO Attending Obstetrician & Gynecologist, [facility] for [employer], [facility]

## 2021-06-15 NOTE — Anesthesia Postprocedure Evaluation (Signed)
Anesthesia Post Note  Patient: Elizabeth Terrell  Procedure(s) Performed: CESAREAN SECTION (Abdomen)     Patient location during evaluation: PACU Anesthesia Type: General Level of consciousness: awake and alert Pain management: pain level controlled Vital Signs Assessment: post-procedure vital signs reviewed and stable Respiratory status: spontaneous breathing, nonlabored ventilation, respiratory function stable and patient connected to nasal cannula oxygen Cardiovascular status: blood pressure returned to baseline and stable Postop Assessment: no apparent nausea or vomiting Anesthetic complications: no   No notable events documented.  Last Vitals:  Vitals:   06/15/21 1308 06/15/21 1420  BP: 126/77 136/70  Pulse: 91 84  Resp: 16 16  Temp: 36.7 C 36.7 C  SpO2: 97% 98%    Last Pain:  Vitals:   06/15/21 1517  TempSrc:   PainSc: 10-Worst pain ever   Pain Goal:                   Kaziyah Parkison

## 2021-06-15 NOTE — Lactation Note (Signed)
This note was copied from a baby's chart. Lactation Consultation Note  Patient Name: Elizabeth Terrell [id number] Date: 06/15/2021 Reason for consult: Initial assessment;NICU baby;Infant < 6lbs;Preterm <34wks;Multiple gestation Age:33 hours  Visited with mom of 7 hours old pre-term NICU twins, her OB Specialty care RN Carollee Herter was getting ready to take her to the NICU. [name] would like to be followed up tomorrow; she has the DEBP pump set in the room and will start pumping once she comes back from visiting babies. Reviewed pumping schedule, lactogenesis II normalcy and expectations. LC to come back tomorrow for initial/follow up assessment.   Feeding Mother's Current Feeding Choice: Breast Milk and Donor Milk  Lactation Tools Discussed/Used Tools: Pump Breast pump type: Double-Electric Breast Pump Pump Education: Setup, frequency, and cleaning;Milk Storage Reason for Pumping: pre-term twins in NICU Pumping frequency: q 3 hours (recommended)  Interventions Interventions: "The NICU and Your Baby" book;LC Services brochure;Breast feeding basics reviewed;DEBP  Consult Status Consult Status: NICU follow-up Date: 06/15/21 Follow-up type: In-patient   Perfecto Purdy Venetia Constable 06/15/2021, 5:42 PM

## 2021-06-15 NOTE — Anesthesia Preprocedure Evaluation (Signed)
Anesthesia Evaluation  Patient identified by MRN, date of birth, ID band Patient awake    Reviewed: Allergy & Precautions, H&P , NPO status , Patient's Chart, lab work & pertinent test results, reviewed documented beta blocker date and time   Airway Mallampati: II  TM Distance: >3 FB Neck ROM: full    Dental no notable dental hx. (+) Teeth Intact, Dental Advisory Given   Pulmonary neg pulmonary ROS,    Pulmonary exam normal breath sounds clear to auscultation       Cardiovascular hypertension, Pt. on medications Normal cardiovascular exam Rhythm:regular Rate:Normal     Neuro/Psych negative neurological ROS  negative psych ROS   GI/Hepatic negative GI ROS, Neg liver ROS,   Endo/Other  Morbid obesity  Renal/GU negative Renal ROS  negative genitourinary   Musculoskeletal   Abdominal   Peds  Hematology negative hematology ROS (+)   Anesthesia Other Findings   Reproductive/Obstetrics (+) Pregnancy                             Anesthesia Physical Anesthesia Plan  ASA: 3 and emergent  Anesthesia Plan: General   Post-op Pain Management:    Induction: Intravenous, Cricoid pressure planned and Rapid sequence  PONV Risk Score and Plan: 3 and Ondansetron, Dexamethasone and Treatment may vary due to age or medical condition  Airway Management Planned: Oral ETT  Additional Equipment: None  Intra-op Plan:   Post-operative Plan: Extubation in OR  Informed Consent: I have reviewed the patients History and Physical, chart, labs and discussed the procedure including the risks, benefits and alternatives for the proposed anesthesia with the patient or authorized representative who has indicated his/her understanding and acceptance.       Plan Discussed with: Anesthesiologist and CRNA  Anesthesia Plan Comments: (  )        Anesthesia Quick Evaluation

## 2021-06-15 NOTE — MAU Note (Addendum)
Patient pulled straight to MAU room 121 by Imagene Sheller, RN. Patient reported she felt as if she needed to push. Flippin, RN, requested help at bedside. Patient reports her water had broken previously but the sac resealed itself. Dr. Crissie Reese, MD, at bedside immediately. Di/Di twins.  Holly Flippin, RN, attempting to place FHR monitors on baby A and baby B. Baby A initial FHR 130. Bedside ultrasound brought to room.  RROB notified of patient's presentation.  Dr. Crissie Reese, MD, performed SVE and patient found to be complete and Plus 3. Dr. Crissie Reese, MD, stated call L&D for a room.   Tera Partridge, RN, notified and report given. Room 202 given.  Agustin Cree, RN, called Neonatologist and report given.  Patient transport to room 202 via stretcher with presence of Dr. Crissie Reese, MD, Imagene Sheller, RN, and RROB.

## 2021-06-15 NOTE — H&P (Signed)
LABOR AND DELIVERY ADMISSION HISTORY AND PHYSICAL NOTE  Lauralye Terrell is a 31 y.o. female G2P1001 with IUP at [redacted]w[redacted]d by 5wk [place] presenting for contractions.   Patient with complex prenatal history to date. Diagnosed with di/di twins and had PPROM at 20 weeks. She subsequently resealed her bags. She has been seen and evaluated for preterm labor on several occasions since PPROM and has been noted to have cervical funneling.  In addition pregnancy has been complicated by IUGR of both fetuses (8% and 3% on most recent scan), and cHTN on meds.   Presents today reporting she was standing at her sink when she sneezed and felt like something was going to come out. Since then has been having contractions and feeling an urge to push.    Prenatal History/Complications: PNC at [facility], though scant amount of care Sono Twin A:  @[redacted]w[redacted]d , CWD, normal anatomy, cephalic presentation, anterior placenta, 8%ile, EFW 537g Sono Twin B: @[redacted]w[redacted]d , CWD, normal anatomy, cephalic presentation, posterior placenta, 3%ile, EFW 537g Discordancy 8%  Pregnancy complications:  - see above  Past Medical History: Past Medical History:  Diagnosis Date   Medical history non-contributory     Past Surgical History: Past Surgical History:  Procedure Laterality Date   CHOLECYSTECTOMY  2018    Obstetrical History: OB History     Gravida  2   Para  1   Term  1   Preterm      AB      Living  1      SAB      IAB      Ectopic      Multiple      Live Births  1           Social History: Social History   Socioeconomic History   Marital status: Single    Spouse name: Not on file   Number of children: Not on file   Years of education: Not on file   Highest education level: Not on file  Occupational History   Not on file  Tobacco Use   Smoking status: Never   Smokeless tobacco: Never  Vaping Use   Vaping Use: Never used  Substance and Sexual Activity   Alcohol use: Not Currently    Comment:  Occas   Drug use: Not Currently   Sexual activity: Not Currently  Other Topics Concern   Not on file  Social History Narrative   Not on file   Social Determinants of Health   Financial Resource Strain: Not on file  Food Insecurity: Not on file  Transportation Needs: Not on file  Physical Activity: Not on file  Stress: Not on file  Social Connections: Not on file    Family History: Family History  Problem Relation Age of Onset   Glaucoma Mother    Heart disease Father    Hypertension Father     Allergies: No Known Allergies  Medications Prior to Admission  Medication Sig Dispense Refill Last Dose   aspirin 81 MG chewable tablet Chew 81 mg by mouth daily.      NIFEdipine (PROCARDIA-XL/NIFEDICAL-XL) 30 MG 24 hr tablet Take 30 mg by mouth daily.      Prenat-Fe Poly-Methfol-FA-DHA (VITAFOL FE+) 90-0.6-0.4-200 MG CAPS Take 1 capsule by mouth daily. 30 capsule 12    Prenatal Vit-Fe Fumarate-FA (MULTIVITAMIN-PRENATAL) 27-0.8 MG TABS tablet Take 1 tablet by mouth daily at 12 noon.        Review of Systems  All systems reviewed and  negative except as stated in HPI  Physical Exam Blood pressure (!) 151/104, pulse 96, height 5\' 2"  (1.575 m), weight (!) 150 kg, last menstrual period 12/03/2020, SpO2 98 %. General appearance: alert, oriented, NAD Lungs: normal respiratory effort Heart: regular rate Abdomen: soft, non-tender; gravid Extremities: No calf swelling or tenderness Presentation: cephalic by SVE  Twin A Fetal monitoringBaseline: 150 bpm, Variability: Good {> 6 bpm), Accelerations: Reactive, and Decelerations: Absent Uterine activityNone  Twin B Fetal monitoringBaseline: 150 bpm, Variability: Good {> 6 bpm), Accelerations: Reactive, and Decelerations: Absent Uterine activityNone  Dilation: 10 Station: Plus 3 Exam by:: Dr. [email or web address]  Prenatal labs: ABO, Rh: --/--/PENDING (06/15 0950) Antibody: PENDING (06/15 0950) Rubella:   RPR:    HBsAg: NON REACTIVE (04/25  0441)  HIV:    GC/Chlamydia:  Neisseria Gonorrhea  Date Value Ref Range Status  04/24/2021 Negative  Final   Chlamydia  Date Value Ref Range Status  04/24/2021 Negative  Final   GBS:    2-hr GTT: not yet done Genetic screening:  no result available Anatomy 04/26/2021: IUGR  Prenatal Transfer Tool  Maternal Diabetes: No Genetic Screening: Not done Maternal Ultrasounds/Referrals: IUGR Fetal Ultrasounds or other Referrals:  Referred to Materal Fetal Medicine  Maternal Substance Abuse:  No Significant Maternal Medications:  Meds include: Other: Nifedipine Significant Maternal Lab Results: Other: GBS unknown  Results for orders placed or performed during the hospital encounter of 06/15/21 (from the past 24 hour(s))  CBC   Collection Time: 06/15/21  9:50 AM  Result Value Ref Range   WBC 10.3 4.0 - 10.5 K/uL   RBC 3.48 (L) 3.87 - 5.11 MIL/uL   Hemoglobin 11.0 (L) 12.0 - 15.0 g/dL   HCT 06/17/21 (L) 78.2 - 95.6 %   MCV 93.1 80.0 - 100.0 fL   MCH 31.6 26.0 - 34.0 pg   MCHC 34.0 30.0 - 36.0 g/dL   RDW 21.3 08.6 - 57.8 %   Platelets 238 150 - 400 K/uL   nRBC 0.0 0.0 - 0.2 %  Type and screen [facility]   Collection Time: 06/15/21  9:50 AM  Result Value Ref Range   ABO/RH(D) PENDING    Antibody Screen PENDING    Sample Expiration      06/18/2021,2359 Performed at [facility], [address] [place], [place]     Patient Active Problem List   Diagnosis Date Noted   Pregnancy 06/15/2021   Dichorionic diamniotic twin pregnancy in second trimester 06/12/2021   Intrauterine growth restriction (IUGR) affecting care of mother 06/12/2021   Hypokalemia 04/25/2021   Gestational thrombocytopenia (HCC) 04/25/2021   Preterm premature rupture of membranes (PPROM) with unknown onset of labor 04/24/2021   Chronic hypertension affecting pregnancy 04/24/2021   Obesity in pregnancy 04/24/2021   Transaminitis 04/24/2021    Assessment: Elizabeth Terrell is a 31 y.o.  G2P1001 at [redacted]w[redacted]d here for preterm labor.  #Labor: taken to L&D directly, NICU notified. I counseled patient on risks of vaginal twin delivery as twin B appears to be in transverse presentation. Discussed that while twin A will certainly deliver vertex that twin B may present breech. Discussed possible breech extraction with primary risks being increased fetal distress and head entrapment. Also discussed possibility of cesarean and consented for cesarean, reviewing risks of bleeding, infection, blood clots, damage to surrounding organs, effects on future pregnancies. Patient gave verbal consent and wished to proceed with attempt at vaginal delivery.   #FWB: Cat I #GBS/ID: unknown, ampicillin ordered #  MOF: TBD #MOC: TBD #Circ: TBD #cHTN: cont Nifedipine #Preterm labor: NICU notified. S/p bmz course  Venora Maples 06/15/2021, 11:07 AM

## 2021-06-15 NOTE — Discharge Summary (Signed)
Postpartum Discharge Summary  Date of Service updated***     Patient Name: Elizabeth Terrell DOB: May 08, 1990 MRN: [id number]  Date of admission: 06/15/2021 Delivery date:   Anayi, Bricco [[id number]]  06/15/2021    Amna, Welker [[id number]]  06/15/2021  Delivering provider:    Juliene, Kirsh [[id number]]  Tanana, Jarold Motto, ASUSENA SIGLEY [[id number]]  Janyth Pupa  Date of discharge: 06/15/2021  Admitting diagnosis: Pregnancy [Z34.90] Intrauterine pregnancy: [redacted]w[redacted]d    Secondary diagnosis:  Principal Problem:   Pregnancy Active Problems:   Preterm premature rupture of membranes (PPROM) with unknown onset of labor   Chronic hypertension affecting pregnancy   Obesity in pregnancy   Cesarean delivery delivered   Vaginal delivery  Additional problems: ***    Discharge diagnosis: Term Pregnancy Delivered and CHTN                                              Post partum procedures:{Postpartum procedures:23558} Augmentation: N/A Complications: {OB Labor/Delivery Complications:20784}  Hospital course: Onset of Labor With Unplanned C/S   31y.o. yo G2P1001 at 238w5das [place]. Patient presented to MAU with contractions. Of note, patient was PPROMed at 20 weeks and membranes thought to be resealed after. Patient was noted to have cervical funneling in subsequent ultrasounds. In MAU on day of delivery patient was found to be 10 cm and was brought up to L&D. Twin A was delivered vaginally successfully. Attempt was made to deliver Twin B vaginally however found to be in transverse position and after AROM unable to rotate and unable to deliver foot and therefore proceeded to STAT cesarean delivery.  Delivery details as follows: Membrane Rupture Time/Date:    BaDaina, Cara0[[id number]]10:17 AM    BaLujean Amel0[[id number]]10:32 AM ,   BaSadi, Arave0[[id number]]06/15/2021    BaJerene, Yeager[0[[id number]]06/15/2021   Delivery Method:   BaPaulo Fruit0[[id number]]Vaginal, Spontaneous    BaMykaela, Arena0[[id number]]C-Section, Low Transverse  Details of operation can be found in separate operative note. Patient had an uncomplicated postpartum course.  She is ambulating,tolerating a regular diet, passing flatus, and urinating well.  Patient is discharged home in stable condition 06/15/21.  Newborn Data: Birth date:   BaSyrah, Daughtrey0[[id number]]06/15/2021    BaRabecca, Birge0[[id number]]06/15/2021  Birth time:   BaYamira, Papa0[[id number]]10:18 AM    BaLujean Amel0[[id number]]10:40 AM  Gender:   BaJennifermarie, Franzen0[[id number]]Female    BaIzabel, Chim0[[id number]]Female  Living status:   BaJaquanda, Wickersham0[[id number]]Living    BaRiah, Ke[place]0[[id number]]Living  Apgars:   BaJocilyn, [place]0[[id number]]7 [address] [place]0[[id number]]3 ,   BaJohnae, Friley0[[id number]]8    [place], [place]0[[id number]]8  Weight:   BaMieko, Kneebone0[[id number]]    BaElorah, Dewing0[[id number]]    Magnesium Sulfate received: {Mag received:30440022} BMZ received: {BMZ received:30440023} Rhophylac:{Rhophylac received:30440032} MMSJG:{GEZ:66294765}-DaP:{Tdap:23962} Flu: {F{YYT:03546}ransfusion:{Transfusion received:30440034}  Physical exam  Vitals:   06/15/21 0947 06/15/21 1010  BP:  (!) 151/104  Pulse:  96  SpO2:  98%  Weight: (!) 150 kg   Height: '5\' 2"'  (1.575 m)    General: {Exam; general:21111117} Lochia: {Desc; appropriate/inappropriate:30686::"appropriate"} Uterine Fundus: {Desc; firm/soft:30687} Incision: {Exam; incision:21111123} DVT Evaluation: {Exam; JZP:9150569} Labs: Lab Results  Component Value Date   WBC 10.3 06/15/2021   HGB 11.0 (L) 06/15/2021   HCT 32.4 (L) 06/15/2021   MCV 93.1 06/15/2021   PLT 238 06/15/2021      Latest Ref Rng &  Units 06/12/2021   11:59 AM  CMP  Glucose 70 - 99 mg/dL 88   BUN 6 - 20 mg/dL 4   Creatinine 0.57 - 1.00 mg/dL 0.47   Sodium 134 - 144 mmol/L 138   Potassium 3.5 - 5.2 mmol/L 3.7   Chloride 96 - 106 mmol/L 101   CO2 20 - 29 mmol/L 19   Calcium 8.7 - 10.2 mg/dL 9.3    Edinburgh Score:     No data to display           After visit meds:  Allergies as of 06/15/2021   No Known Allergies   Med Rec must be completed prior to using this [facility]***        Discharge home in stable condition Infant Feeding: {Baby feeding:23562} Infant Disposition:{CHL IP OB HOME WITH VXYIAX:65537} Discharge instruction: per After Visit Summary and Postpartum booklet. Activity: Advance as tolerated. Pelvic rest for 6 weeks.  Diet: {OB SMOL:07867544} Future Appointments: Future Appointments  Date Time Provider [place]  06/21/2021  8:20 AM [facility] LAB [facility]  06/21/2021  8:55 AM Woodroe Mode, MD [facility]  06/21/2021  9:30 AM [facility] NURSE [facility]  06/21/2021  9:45 AM [facility] US7 [facility]  06/21/2021 10:45 AM [facility] NST [facility]  06/28/2021 12:30 PM [facility] NURSE [facility]  06/28/2021 12:45 PM [facility] US4 [facility]  06/28/2021  2:15 PM [facility] NST [facility] [place]   Follow up Visit: Message sent to [facility] by Dr. Cy Blamer on 6/15 Please schedule this patient for a In person postpartum visit in 4 weeks with the following provider: Any provider. Additional Postpartum F/U:Incision check 1 week and BP check 1 week  High risk pregnancy complicated by:  PPROM, Preterm delivery Delivery mode:     Ronnika, Collett [[id number]]  Vaginal, Spontaneous    Blenda, [place] [[id number]]  C-Section, Low Transverse  Anticipated Birth Control:  Unsure   06/15/2021 Renard Matter, MD

## 2021-06-15 NOTE — Transfer of Care (Signed)
Immediate Anesthesia Transfer of Care Note  Patient: Elizabeth Terrell  Procedure(s) Performed: CESAREAN SECTION (Abdomen)  Patient Location: PACU  Anesthesia Type:General  Level of Consciousness: drowsy  Airway & Oxygen Therapy: Patient Spontanous Breathing  Post-op Assessment: Report given to RN and Post -op Vital signs reviewed and stable  Post vital signs: Reviewed and stable  Last Vitals:  Vitals Value Taken Time  BP 126/75 06/15/21 1142  Temp 36.7 C 06/15/21 1142  Pulse 86 06/15/21 1145  Resp 16 06/15/21 1145  SpO2 100 % 06/15/21 1145  Vitals shown include unvalidated device data.  Last Pain:  Vitals:   06/15/21 1142  TempSrc: Oral  PainSc: 10-Worst pain ever         Complications: No notable events documented.

## 2021-06-16 ENCOUNTER — Other Ambulatory Visit (HOSPITAL_COMMUNITY): Payer: Self-pay

## 2021-06-16 ENCOUNTER — Encounter (HOSPITAL_COMMUNITY): Payer: Self-pay | Admitting: Family Medicine

## 2021-06-16 LAB — CBC
HCT: 27.3 % — ABNORMAL LOW (ref 36.0–46.0)
Hemoglobin: 9 g/dL — ABNORMAL LOW (ref 12.0–15.0)
MCH: 30.8 pg (ref 26.0–34.0)
MCHC: 33 g/dL (ref 30.0–36.0)
MCV: 93.5 fL (ref 80.0–100.0)
Platelets: 204 10*3/uL (ref 150–400)
RBC: 2.92 MIL/uL — ABNORMAL LOW (ref 3.87–5.11)
RDW: 13.6 % (ref 11.5–15.5)
WBC: 11.8 10*3/uL — ABNORMAL HIGH (ref 4.0–10.5)
nRBC: 0 % (ref 0.0–0.2)

## 2021-06-16 LAB — RPR: RPR Ser Ql: NONREACTIVE

## 2021-06-16 MED ORDER — OXYCODONE HCL 5 MG PO TABS
5.0000 mg | ORAL_TABLET | Freq: Four times a day (QID) | ORAL | 0 refills | Status: DC | PRN
  Filled 2021-06-16: qty 30, 7d supply, fill #0

## 2021-06-16 MED ORDER — NIFEDIPINE ER 30 MG PO TB24
30.0000 mg | ORAL_TABLET | Freq: Every day | ORAL | 0 refills | Status: DC
  Filled 2021-06-16: qty 30, 30d supply, fill #0

## 2021-06-16 MED ORDER — FUROSEMIDE 20 MG PO TABS
20.0000 mg | ORAL_TABLET | Freq: Two times a day (BID) | ORAL | 0 refills | Status: DC
  Filled 2021-06-16: qty 10, 5d supply, fill #0

## 2021-06-16 MED ORDER — SODIUM CHLORIDE 0.9 % IV SOLN
500.0000 mg | Freq: Once | INTRAVENOUS | Status: AC
  Administered 2021-06-16: 500 mg via INTRAVENOUS
  Filled 2021-06-16: qty 25

## 2021-06-16 MED ORDER — IBUPROFEN 600 MG PO TABS
600.0000 mg | ORAL_TABLET | Freq: Four times a day (QID) | ORAL | 0 refills | Status: DC | PRN
  Filled 2021-06-16: qty 60, 15d supply, fill #0

## 2021-06-16 MED ORDER — SENNOSIDES-DOCUSATE SODIUM 8.6-50 MG PO TABS
2.0000 | ORAL_TABLET | Freq: Two times a day (BID) | ORAL | 0 refills | Status: DC | PRN
  Filled 2021-06-16: qty 30, 8d supply, fill #0

## 2021-06-16 NOTE — Clinical Social Work Maternal (Signed)
CLINICAL SOCIAL WORK MATERNAL/CHILD NOTE  Patient Details  Name: Carlise Porrata MRN: [id number] Date of Birth: 10/05/1990  Date:  06/16/2021  Clinical Social Worker Initiating Note:  Zaineb Nowaczyk [place] Date/Time: Initiated:  06/16/21/1414     Child's Name:  Nahseem adn Naheema McKver   Biological Parents:  Mother, Father   Need for Interpreter:  None   Reason for Referral:  Parental Support of Premature Babies < 32 weeks/or Critically Ill babies   Address:  [address] [place]  [address]    Phone number:  [phone] (home)     Additional phone number:   Household Members/Support Persons (HM/SP):   Household Member/Support Person 1, Household Member/Support Person 2   HM/SP Name Relationship DOB or Age  HM/SP -1 Keen McKiver FOB 09/17/1981  HM/SP -2 Malachi Powless son 12/10/2016  HM/SP -3        HM/SP -4        HM/SP -5        HM/SP -6        HM/SP -7        HM/SP -8          Natural Supports (not living in the home):   (MOB report that she has no local support aside from FOB. However, her family will provide support from [place].)   Professional Supports: Therapist (MOB's therapist is Hailey Rose with [facility].)   Employment:     Type of Work:     Education:  Some College   Homebound arranged:    Financial Resources:  Medicaid   Other Resources:  Food Stamps  , WIC   Cultural/Religious Considerations Which May Impact Care:  Per MOB's face sheet, MOB is Christian.   Strengths:  Pediatrician chosen   Psychotropic Medications:         Pediatrician:    [place] area  Pediatrician List:   [place]    [place] Other ([facility] [place]  [place]    [place]    [place]    [place]      Pediatrician Fax Number:    Risk Factors/Current Problems:  Transportation     Cognitive State:      Mood/Affect:  Comfortable  , Calm  , Interested  , Agitated     CSW Assessment: CSW met with MOB at  twins bedside in room 349.  When CSW arrived, MOB was pumping.  CSW offered to return at a later time and MOB declined. MOB was polite and easy to engage.  MOB denied any MH hx and SA hx however MOB communicated that she has a therapist (Hailey Rose) with [facility].   MOB communicated that she does not have any essential items for twins. Per MOB, she will need assistance with getting car seats and beds.  CSW agreed to provide MOB with support of obtaining essential items when twins are to discharge.   MOB shared feeling well informed by NICU team and she denied having any questions or concerns.   CSW reviewed twins eligibility for SSI Disability Benefits.  MOB shared feeling aware about the application process due to MOB's son receiving benefits for Autism. MOB was encouraged to reach out to CSW when if any questions arise.   MOB communicated that transportation maybe a barrier for her.  MOB is currently using [employer].  CSW encouraged MOB to utilize the service and CSW agreed to provided a NICU verification letter if needed.     CSW will continue to offer resources and supports to family while infant remains in NICU.    CSW Plan/Description:  Psychosocial Support and Ongoing Assessment of Needs, Perinatal Mood and Anxiety Disorder (PMADs) Education, Other Patient/Family Education, Theatre stage manager Income (SSI) Information, Other Information/Referral to [place], MSW, [employer] Clinical Social Work [phone]   Dimple Nanas, LCSW 06/16/2021, 2:18 PM

## 2021-06-16 NOTE — Lactation Note (Signed)
This note was copied from a baby's chart.  NICU Lactation Consultation Note  Patient Name: Elizabeth Terrell [id number] Date: 06/16/2021 Age:33 hours  Subjective Reason for consult: Initial assessment; NICU baby; Infant < 6lbs; Multiple gestation; Preterm <34wks  Visited with mom of 65 hours old pre-term NICU twins, she's a P2 and has some experience breastfeeding her first child. She started yesterday, she's pumped twice for far, explained to Elizabeth Terrell the importance of consistent pumping to protect her supply, she voiced understanding. Provided breastmilk labels, NICU RN Elizabeth Terrell did the 2 step-verification with LC. Reviewed lactogenesis II, pumping schedule, guidelines and benefits of premature milk for NICU babies. This LC also called OB Specialty care to have mom's pump parts that were missing (yellow membranes) to be tubed to the 3rd floor, assisted with this pumping session.   Objective Infant data: Mother's Current Feeding Choice: Breast Milk and Donor Milk  Infant feeding assessment Will start donor milk this morning  Maternal data: [id number]  Vaginal, Spontaneous Current breast feeding challenges:: NICU admission Does the patient have breastfeeding experience prior to this delivery?: Yes How long did the patient breastfeed?: 3 months Pumping frequency: q 3 hours (recommended) Pumped volume: 0 mL (drops) Flange Size: 27 Risk factor for low milk supply:: prematurity, blood loss of 800 cc. WIC Program: Yes WIC Referral Sent?: Yes  Assessment Infant: Feeding Status: NPO  Maternal: Milk volume: Normal  Intervention/Plan Interventions: Breast feeding basics reviewed; DEBP; Education Tools: Pump; Flanges Pump Education: Setup, frequency, and cleaning; Milk Storage  Plan of care: Encouraged mom to start pumping consistently, at least every 3 hours, 8 times/24 hours ideally Hand expression and breast massage were also encouraged She'll start bringing her drops/EBM to  NICU  No other support person at this time. All questions and concerns answered, family to contact [facility] services PRN.  Consult Status: NICU follow-up NICU Follow-up type: Maternal D/C visit; Verify onset of copious milk; Verify absence of engorgement   Elizabeth Terrell 06/16/2021, 2:10 PM

## 2021-06-16 NOTE — Progress Notes (Signed)
POSTPARTUM PROGRESS NOTE  POD #1  Subjective:  Elizabeth Terrell is a 31 y.o. [phone] s/p NSVD twin A and pLTCS Twin B at [redacted]w[redacted]d. Today she notes no acute complaints. She denies any problems with ambulating, voiding or po intake. Denies nausea or vomiting. She has passed flatus, no BM.  Pain is well controlled.  Lochia minimal Denies fever/chills/chest pain/SOB.  no HA, no blurry vision, no RUQ pain  Objective: Blood pressure (!) 96/54, pulse 92, temperature 98.3 F (36.8 C), temperature source Oral, resp. rate 17, height 5\' 2"  (1.575 m), weight (!) 150 kg, last menstrual period 12/03/2020, SpO2 100 %, unknown if currently breastfeeding.  Physical Exam:  General: alert, cooperative and no distress Chest: no respiratory distress Heart: regular rate and rhythm Abdomen: obese, soft, nontender, +BS Uterine Fundus: firm, appropriately tender Incision: C/D/I DVT Evaluation: No calf swelling or tenderness Extremities: 1+ edema Skin: warm, dry  Results for orders placed or performed during the hospital encounter of 06/15/21 (from the past 24 hour(s))  CBC     Status: Abnormal   Collection Time: 06/15/21  9:50 AM  Result Value Ref Range   WBC 10.3 4.0 - 10.5 K/uL   RBC 3.48 (L) 3.87 - 5.11 MIL/uL   Hemoglobin 11.0 (L) 12.0 - 15.0 g/dL   HCT 06/17/21 (L) 64.3 - 32.9 %   MCV 93.1 80.0 - 100.0 fL   MCH 31.6 26.0 - 34.0 pg   MCHC 34.0 30.0 - 36.0 g/dL   RDW 51.8 84.1 - 66.0 %   Platelets 238 150 - 400 K/uL   nRBC 0.0 0.0 - 0.2 %  Type and screen [facility]     Status: None   Collection Time: 06/15/21  9:50 AM  Result Value Ref Range   ABO/RH(D) A POS    Antibody Screen NEG    Sample Expiration      06/18/2021,2359 Performed at [facility], [address] [place], [place]     Assessment/Plan: [name] is a 31 y.o. [phone] s/p NSVD/pLTCS at [redacted]w[redacted]d POD#1 complicated by: 1) chronic HTN- currently on procardia XL 30mg - BP low this am- will hold  medication this am -Lasix 20mg  daily -no evidence of preeclampsia 2) Postop -pain well controlled -voiding freeily -Lovenox for DVT prophylaxis  Contraception: plan for outpatient IUD Feeding: in NICU  Dispo: continue routine postop care   LOS: 1 day   [redacted]w[redacted]d, DO Faculty Attending, [facility] 06/16/2021, 7:05 AM

## 2021-06-17 NOTE — Lactation Note (Signed)
This note was copied from a baby's chart.  NICU Lactation Consultation Note  Patient Name: Elizabeth Terrell TUUEK'C Date: 06/17/2021 Age:33 hours  Subjective Reason for consult: Follow-up assessment; NICU baby; Multiple gestation; Preterm <34wks; Mother's request  RN Elizabeth Terrell from [facility] called [facility] because mom thought she was getting engorged. Meet again with patient and noticed that her milk is now coming in, her RN has already provided some ice packs to relieve the discomfort she was experiencing on the L breast. L breast was soft upon examination with small "knots" on the lower outer quadrant consistent with the early physiological onset of secretory activation. Continue current plan of care, mom to contact LC services PRN.  Objective Infant data: Mother's Current Feeding Choice: Breast Milk and Donor Milk  Maternal data: [name]  Vaginal, Spontaneous Current breast feeding challenges:: NICU admission Does the patient have breastfeeding experience prior to this delivery?: Yes How long did the patient breastfeed?: 3 months  Pumping frequency: 5 times/24 hours Pumped volume: 24 mL Flange Size: 27 Risk factor for low milk supply:: prematurity, blood loss of 800 cc. WIC Program: Yes WIC Referral Sent?: Yes Pump: DEBP, Stork Pump (insurance pump)  Assessment Infant: On scheduled feedings of breastmilk  Maternal: Milk volume: Normal  Intervention/Plan Interventions: Breast feeding basics reviewed; Coconut oil; DEBP; Education; Ice Tools: Pump; Flanges; Coconut oil Pump Education: Setup, frequency, and cleaning; Milk Storage  Plan: Consult Status: NICU follow-up NICU Follow-up type: Maternal D/C visit; Verify onset of copious milk; Verify absence of engorgement   Elizabeth Terrell S Elizabeth Terrell 06/17/2021, 5:29 PM

## 2021-06-17 NOTE — Lactation Note (Signed)
This note was copied from a baby's chart.  NICU Lactation Consultation Note  Patient Name: Elizabeth Terrell [id number] Date: 06/17/2021 Age:33 hours  Subjective Reason for consult: Follow-up assessment; NICU baby; Preterm <34wks; Multiple gestation  Visited with mom of 33 hours old pre-term NICU twins, she reports that pumping is going well although still not pumping every 3 hours. Provided a pumping top on size XL and fitted Ms. Basset with it, her flanges were also updated to size # 30, her OB Specialty care RN Selena Batten will be getting some coconut oil for her as well.  Reviewed some breastfeeding basics, hands on pumping and advised not to wash her nipples prior pumping sessions and only do it while in the shower without applying soap directly to them.   Objective Infant data: Mother's Current Feeding Choice: Breast Milk and Donor Milk  Infant feeding assessment On scheduled feedings, in NICU  Maternal data: [id number]  C-Section, Low Transverse Current breast feeding challenges:: NICU admission Does the patient have breastfeeding experience prior to this delivery?: Yes How long did the patient breastfeed?: 3 months Pumping frequency: 4 times/24 hours Pumped volume: 5 mL Flange Size: 30 Risk factor for low milk supply:: prematurity, blood loss of 800 cc Pump: DEBP, Stork Pump (insurance pump)  Assessment Infant: Feeding Status: -- (Scheduled feedings)  Maternal: Milk volume: Normal  Intervention/Plan Interventions: Breast feeding basics reviewed; Coconut oil; DEBP; Education Tools: Pump; Flanges; Coconut oil; Hands-free pumping top (size XL) Pump Education: Setup, frequency, and cleaning; Milk Storage  Plan of care: Encouraged mom to try pumping consistently, at least every 3 hours, 8 times/24 hours ideally Hand expression and breast massage were also encouraged, as well as hands on pumping   No other support person at this time. All questions and concerns answered, family  to contact [facility] services PRN.  Consult Status: NICU follow-up NICU Follow-up type: Verify onset of copious milk; Maternal D/C visit; Verify absence of engorgement   Tashanti Dalporto S Jonne Rote 06/17/2021, 12:32 PM

## 2021-06-17 NOTE — Progress Notes (Signed)
Subjective: Postpartum Day 2: Cesarean Delivery Patient reports incisional pain, tolerating PO, and no problems voiding.    Objective: Vital signs in last 24 hours: Temp:  [98.2 F (36.8 C)-98.5 F (36.9 C)] 98.4 F (36.9 C) (06/17 8366) Pulse Rate:  [96-113] 102 (06/17 0638) Resp:  [16-19] 19 (06/17 2947) BP: (100-134)/(59-85) 100/59 (06/17 6546) SpO2:  [95 %-99 %] 95 % (06/17 5035)  Physical Exam:  General: alert, cooperative, and no distress Lochia: appropriate Uterine Fundus: firm Incision: healing well, no significant drainage, no dehiscence, no significant erythema DVT Evaluation: No evidence of DVT seen on physical exam.  Recent Labs    06/15/21 0950 06/16/21 0650  HGB 11.0* 9.0*  HCT 32.4* 27.3*    Assessment/Plan: Status post Cesarean section. Doing well postoperatively.  Continue current care Discharge tomorrow.  Lazaro Arms 06/17/2021, 7:51 AM

## 2021-06-18 MED ORDER — ALUM & MAG HYDROXIDE-SIMETH 200-200-20 MG/5ML PO SUSP
30.0000 mL | Freq: Once | ORAL | Status: AC | PRN
  Administered 2021-06-18: 30 mL via ORAL
  Filled 2021-06-18: qty 30

## 2021-06-18 MED ORDER — OXYCODONE HCL 5 MG PO TABS: 5.0000 mg | ORAL_TABLET | Freq: Four times a day (QID) | ORAL | 0 refills | Status: AC | PRN

## 2021-06-18 MED ORDER — IBUPROFEN 600 MG PO TABS: 600.0000 mg | ORAL_TABLET | Freq: Four times a day (QID) | ORAL | 2 refills | Status: AC | PRN

## 2021-06-18 MED ORDER — SENNOSIDES-DOCUSATE SODIUM 8.6-50 MG PO TABS
2.0000 | ORAL_TABLET | Freq: Two times a day (BID) | ORAL | 2 refills | Status: AC | PRN
Start: 2021-06-18 — End: ?

## 2021-06-18 MED ORDER — NIFEDIPINE ER OSMOTIC RELEASE 60 MG PO TB24
60.0000 mg | ORAL_TABLET | Freq: Every day | ORAL | Status: DC
Start: 2021-06-18 — End: 2021-06-18
  Administered 2021-06-18: 60 mg via ORAL
  Filled 2021-06-18: qty 1

## 2021-06-18 MED ORDER — NIFEDIPINE ER 60 MG PO TB24: 60.0000 mg | ORAL_TABLET | Freq: Every day | ORAL | 0 refills | Status: AC

## 2021-06-18 MED ORDER — FUROSEMIDE 20 MG PO TABS
20.0000 mg | ORAL_TABLET | Freq: Two times a day (BID) | ORAL | 0 refills | Status: AC
Start: 2021-06-18 — End: 2021-06-22

## 2021-06-18 NOTE — Lactation Note (Addendum)
This note was copied from a baby's chart.  NICU Lactation Consultation Note  Patient Name: Elizabeth Terrell [id number] Date: 06/18/2021 Age:33 hours   Subjective Reason for consult: Follow-up assessment Mother's milk is in and she is experiencing slight discomfort. She is using icepacks and pumping frequently day and night. We discussed strategies to avoid engorgement p d/c. Mother is aware of LC services in NICU.   Mother has a 8 year old son at home. Her visits will likely be limited by his daycare schedule. She provided her phone number for lactation f/u if she is unavailable in-person [phone]).  Objective Infant data: Mother's Current Feeding Choice: Breast Milk    Maternal data: [id number]  Vaginal, Spontaneous Pumping frequency: q3h Pumped volume: 120 mL Flange Size: 27  WIC Program: Yes WIC Referral Sent?: Yes Pump: DEBP, Stork Pump (insurance pump)  Assessment Maternal: Milk volume: Normal  Mother has a golf ball size knot on the outer quadrant of her R breast. She is able to use ice/moist warm heat to improve symptoms and she is able to remove milk while pumping. She is at risk for engorgement and has early s/s that should be monitored over the following days.  Intervention/Plan Interventions: Ice; Education  Tools: Pump; 103F feeding tube / Syringe Pump Education: Setup, frequency, and cleaning; Milk Storage  Plan: Consult Status: NICU follow-up  NICU Follow-up type: Verify absence of engorgement; Weekly NICU follow up  Mother to continue ice/heat/pumping q3h. LC team to f/u for continued support/assistance related to engorgement risk.  Elder Negus 06/18/2021, 9:23 AM

## 2021-06-19 ENCOUNTER — Ambulatory Visit: Payer: Self-pay

## 2021-06-19 LAB — SURGICAL PATHOLOGY

## 2021-06-19 NOTE — Lactation Note (Signed)
This note was copied from a baby's chart. Lactation Consultation Note Mother continues to pump often with volumes approx. q3h. She is uncomfortable but not engorged. We reviewed strategies to avoid engorgement and maximize pumping sessions.   Patient Name: Elizabeth Terrell [id number] Date: 06/19/2021   Age:33 days   Elder Negus 06/19/2021, 3:48 PM

## 2021-06-21 ENCOUNTER — Ambulatory Visit: Payer: Medicaid Other

## 2021-06-21 ENCOUNTER — Other Ambulatory Visit: Payer: Medicaid Other

## 2021-06-21 ENCOUNTER — Ambulatory Visit: Payer: Self-pay

## 2021-06-21 ENCOUNTER — Encounter: Payer: Medicaid Other | Admitting: Obstetrics & Gynecology

## 2021-06-21 NOTE — Lactation Note (Signed)
This note was copied from a baby's chart.  NICU Lactation Consultation Note  Patient Name: Elizabeth Terrell NTIRW'E Date: 06/21/2021 Age:31 years  Subjective Reason for consult: Follow-up assessment; Mother's request; NICU baby; Preterm <34wks; Infant < 6lbs; Multiple gestation  Visited with mom of 33 years old pre-term NICU twins, she requested LC assistance because she needed a different flange size. This LC sized mom with a # 30 flange the last time but she voiced that it's started to rub too much around her nipple. She went to the [facility] office and she was sized there with # 36 flanges, she noted that it's a much better fit and requested them among other breastfeeding supplies. Noticed that the # 36 are too big for her at this point, but the manufacturer doesn't make flanges between # 30-36. Ms. Koons also endorsed some small knots on her R breast on the upper right quadrant; they're not painful at rest but sightly tender to touch. Provided education regarding plug ducts, no S/S of engorgement at this point, both breasts feel soft upon examination.   Objective Infant data: Mother's Current Feeding Choice: Breast Milk Infant feeding assessment On gavage feedings  Maternal data: G2P1103  C-Section, Low Transverse Pumping frequency: 6 times/24 hours Pumped volume: 240 mL Flange Size: 36 Pump: DEBP, Stork Pump  Assessment Infant: In NICU  Maternal: Milk volume: Normal  Intervention/Plan Interventions: Breast feeding basics reviewed; Breast massage; Coconut oil; DEBP; Education Tools: Pump; Flanges; Coconut oil; Hands-free pumping top (size XL) Pump Education: Setup, frequency, and cleaning; Milk Storage  Plan of care: Encouraged mom to try pumping consistently, at least every 3 hours, 8 times/24 hours ideally Hand expression and breast massage were also encouraged, as well as hands on pumping She'll apply moist heat and/or ice as needed prior pumping   No other support  person at this time. All questions and concerns answered, family to contact [facility] services PRN.  Consult Status: NICU follow-up NICU Follow-up type: Weekly NICU follow up   Lauryl Seyer S Philis Nettle 06/21/2021, 2:01 PM

## 2021-06-22 ENCOUNTER — Other Ambulatory Visit: Payer: Self-pay

## 2021-06-22 ENCOUNTER — Ambulatory Visit (INDEPENDENT_AMBULATORY_CARE_PROVIDER_SITE_OTHER): Payer: Medicaid Other

## 2021-06-22 VITALS — BP 134/89 | HR 84 | Ht 62.0 in | Wt 316.2 lb

## 2021-06-22 DIAGNOSIS — Z5189 Encounter for other specified aftercare: Secondary | ICD-10-CM

## 2021-06-28 ENCOUNTER — Ambulatory Visit: Payer: Medicaid Other

## 2021-06-28 ENCOUNTER — Ambulatory Visit: Payer: Self-pay

## 2021-06-28 NOTE — Lactation Note (Signed)
This note was copied from a baby's chart. Lactation Consultation Note  Mother has R inner quadrant breast redness and palpable lump in outer quadrant with pain throughout breast. She is experiencing a decrease in output today from both breasts. I assisted her with pumping. I decreased her flange size to 49mm and provided moist warm compress to use for comfort prn. Mother expressed about from both breasts combined, which is significantly less than the volume she was previously pumping.   I counseled on s/s of mastitis and advised her to present at [facility]U if symptoms worsened. I also suggested lecithin for plugged ducts. Mother to pump q3h today and consider pumping one breast at a time for her own comfort. LC to plan f/u tomorrow.   Patient Name: Elizabeth Terrell Date: 06/28/2021   Age:33 days  Elder Negus 06/28/2021, 2:57 PM

## 2021-06-29 ENCOUNTER — Telehealth: Payer: Self-pay

## 2021-06-29 NOTE — Telephone Encounter (Signed)
Incoming fax from [facility] stating patient scored 11 on Edinburgh scale. Patient reports she is participating in weekly therapy sessions. Will follow up at [facility] visit.

## 2021-06-30 ENCOUNTER — Ambulatory Visit: Payer: Self-pay

## 2021-06-30 NOTE — Lactation Note (Signed)
This note was copied from a baby's chart.  NICU Lactation Consultation Note  Patient Name: Elizabeth Terrell MYTRZ'N Date: 06/30/2021 Age:33 wk.o.   Subjective Reason for consult: Follow-up assessment; Mother's request; Nipple pain/trauma Mother continues to have nipple pain and redness surrounding R breast. She describes pain that feels like "needles" and itching. She has a hx of yeast infections. I encouraged her to call her MD and suggested she try some over the counter miconazole. We reviewed s/s of mastitis.    Objective Infant data: Mother's Current Feeding Choice: Breast Milk   Maternal data: [id number]  Vaginal, Spontaneous Pumping frequency: q3h Pumped volume: 360 mL  WIC Program: Yes WIC Referral Sent?: Yes Pump: DEBP, Stork Pump (insurance pump)  Assessment Maternal: Milk volume: Abundant Maternal symptoms are consistent with yeast infection of breast  Intervention/Plan Interventions: Education  Plan: Consult Status: NICU follow-up  NICU Follow-up type: Weekly NICU follow up  Mother to f/u with MD for diagnosis and treatment of nipple/breast pain.  Elder Negus 06/30/2021, 4:55 PM

## 2021-07-02 ENCOUNTER — Ambulatory Visit: Payer: Self-pay

## 2021-07-02 NOTE — Lactation Note (Signed)
This note was copied from a baby's chart.  NICU Lactation Consultation Note  Patient Name: Elizabeth Terrell JSHFW'Y Date: 07/02/2021 Age:33 wk.o.   Subjective Reason for consult: Follow-up assessment; Mother's request; NICU baby; Multiple gestation; Nipple pain/trauma  Lactation conducted lengthy consult with Ms. Niedermeier to address chronic breast pain with pumping. I assessed her breasts and observed a pumping session.  The right nipple is significant for a line of demarcation (blanching) at the base of the nipple which is where the flange touches while pumping. The tip of the nipple appears white and blistered or scabbed over. It appears as if there is some skin rubbing off as well.  The left nipple also has a smaller area of white on the tip of the nipple.  We reduced flange size to a 27. This appears to be a better fit for the left nipple; the 30 may be best for the right.  I encouraged Ms. Siebers to call her dr tomorrow to check these areas and rule out or confirm presence of yeast. Patient had C/S with antibiotics and has a history of recurrent yeast infections.  I encouraged her to explore probiotics and to address her tight bra and tight pumping bra. She states that she recently began sleeping with the bra on. I recommended that she wash her clothing in hot water with each wearing. And I recommended that she change (or wash) her bras daily.   She would like follow up from lactation on 7/3.  Objective Infant data: Mother's Current Feeding Choice: Breast Milk    Maternal data: [id number]  Vaginal, Spontaneous  Current breast feeding challenges:: NICU; Nipple pain  Pumped volume: 160 mL Flange Size: 27   WIC Program: Yes WIC Referral Sent?: Yes Pump: DEBP  Assessment  Maternal: Milk volume: Normal (right breast is pumping half as much)   Intervention/Plan Interventions: Breast feeding basics reviewed; DEBP; Education; Hand express; Coconut oil  Tools: Pump;  Flanges Pump Education: Setup, frequency, and cleaning  Plan: Consult Status: NICU follow-up  NICU Follow-up type: Weekly NICU follow up    Walker Shadow 07/02/2021, 10:48 AM

## 2021-07-08 ENCOUNTER — Ambulatory Visit: Payer: Self-pay

## 2021-07-08 NOTE — Lactation Note (Signed)
This note was copied from a baby's chart.  NICU Lactation Consultation Note  Patient Name: Elizabeth Terrell [id number] Date: 07/08/2021 Age:31 yr.o.  Subjective Reason for consult: Mother's request; NICU baby; Infant < 6lbs; Multiple gestation; Preterm <34wks; Follow-up assessment; Nipple pain/trauma  Visited with mom of 48 49/59 weeks old (adjusted) NICU twins, she still complains about breast pain, she voiced that her OB prescribed a compound that the insurance didn't cover (it was 54 dollars) and since no treatment has been initiated yet there hasn't been any symptom relief. Advised mom to call the OB office again and ask if it's possible to get something orally so the insurance will cover it; especially give her Hx of yeast infections. Ms. Gawlik is very frustrated at this point and she has even thought about quitting pumping. She went back to using flange sizes # 30 on both sides. She's not icing her breasts at home either because her milk is taking up all the space in her freezer and there's no room for ice to make ice packs. Provided ice packs, another pumping top in size XL and 8 oz. bottles.   Objective Infant data: Mother's Current Feeding Choice: Breast Milk  Infant feeding assessment On scheduled gavage feedings  Maternal data: G2P1103  C-Section, Low Transverse Pumping frequency: 6 times/24 hours Pumped volume: 240 mL (240-300 ml) Flange Size: 30 Pump: DEBP, Stork Pump  Assessment Infant: In NICU  Maternal: Milk volume: Normal (for twins)  Intervention/Plan Interventions: Breast feeding basics reviewed; Breast massage; Breast compression; Coconut oil; DEBP; Education; Ice Tools: Pump; Flanges; Coconut oil; Hands-free pumping top (Size XL) Pump Education: Setup, frequency, and cleaning; Milk Storage  Plan of care: Encouraged mom to continue pumping consistently, at least every 3 hours, 6-8 times/24 hours ideally She'll continue using coconut oil prior pumping, she  said it helps relieve her nipples soreness She'll apply moist heat and/or ice as needed prior pumping   No other support person at this time. All questions and concerns answered, family to contact [facility] services PRN.  Consult Status: NICU follow-up NICU Follow-up type: Weekly NICU follow up   Elizabeth Terrell 07/08/2021, 4:46 PM

## 2021-07-14 ENCOUNTER — Ambulatory Visit: Payer: Self-pay

## 2021-07-14 NOTE — Lactation Note (Signed)
This note was copied from a baby's chart.  NICU Lactation Consultation Note  Patient Name: Elizabeth Terrell [id number] Date: 07/14/2021 Age:33 wk.o.   Subjective Reason for consult: Follow-up assessment  Lactation called Elizabeth Terrell to check on her. She states that she is on her way to her OB appointment now. I recommended that she have her OB assess her breasts given her experience with pain with pumping and white areas on the nipples.   Elizabeth Terrell states that pumping has improved since she began using coconut oil directly on her nipples (vs. On the flanges).  She did not purchase the compound cream because her insurance did not cover it. She is noting positive results with the coconut oil.  Yesterday she purchased a wearable pump for return to work.   She states that she will be visiting her babies today in the NICU and would like lactation to follow up.  Objective Infant data: Mother's Current Feeding Choice: Breast Milk    Maternal data: [id number]  Vaginal, Spontaneous Current breast feeding challenges:: nipple pain with pumping   WIC Program: Yes WIC Referral Sent?: Yes Pump: Personal, Hands Free (purchased a wearable pump on 7/13 for return to work)  Intervention/Plan  Tools: Pump  Plan: Consult Status: NICU follow-up  NICU Follow-up type: Weekly NICU follow up    Walker Shadow 07/14/2021, 9:18 AM

## 2021-07-14 NOTE — Lactation Note (Signed)
This note was copied from a baby's chart.  NICU Lactation Consultation Note  Patient Name: Chesney Suares [id number] Date: 07/14/2021 Age:31 years old.   Subjective Reason for consult: Follow-up assessment; Mother's request; NICU baby; Preterm <34wks; Multiple gestation  Lactation followed up with Ms. Dismore and conducted a full pumping consult. I observed her pump today and provided some education.  Ms. Yurick symptoms with pumping (blanching on nipples) appeared to have improved. She noted that the white-ish areas were likely skin that she states rubbed off. She has been using coconut oil on the affected areas and has noticed improvement.  I observed her pump with size 30 flanges. I noted that the flanges would "slip" at times putting her nipple off center. This occurred while she was holding the flanges in place. This movement of the flange may be contributing to friction from the flange on the nipple when it's not centered.  I moved the flanges back in place and provided her with a top to create a pumping bra.  Ms. Oyervides states frustration with pumping. However, she also states that she's committed to providing her EBM until babies are older. She voices commitment to pumping. She recently purchased a wearable pump for home, but she notes that the flanges may be too small.  We discussed how she's feeling about things, and she noted that she saw her OB today and received a prescription for an antidepressant, which appears to be compatible with breastfeeding.  Objective Infant data: Mother's Current Feeding Choice: Breast Milk   Maternal data: [id number]  C-Section, Low Transverse  Current breast feeding challenges:: pain with breast pumping  Pumping frequency: q4 hours - 6 times/day Pumped volume: 150 mL (150 - 170) Flange Size: 30   Pump:  (purchased a wearable pump from [employer])  Assessment Maternal: Milk volume: Abundant   Intervention/Plan Interventions: Breast  feeding basics reviewed; DEBP; Education  Tools: Pump; Hands-free pumping top Pump Education: Setup, frequency, and cleaning; Milk Storage  Plan: Consult Status: NICU follow-up  NICU Follow-up type: Weekly NICU follow up    Walker Shadow 07/14/2021, 4:56 PM

## 2021-07-20 ENCOUNTER — Ambulatory Visit: Payer: Medicaid Other | Admitting: Family Medicine

## 2021-08-06 ENCOUNTER — Ambulatory Visit: Payer: Self-pay

## 2021-08-06 NOTE — Lactation Note (Addendum)
This note was copied from a baby's chart.  NICU Lactation Consultation Note  Patient Name: Elizabeth Terrell TUUEK'C Date: 08/06/2021 Age:33 wk.o.   Subjective Reason for consult: Breastfeeding assistance Assisted with positioning and provided lick and learn ed. Infant did not wake; dyad sts during gavage feeding.   Objective Infant data: Mother's Current Feeding Choice: Breast Milk  Infant feeding assessment Scale for Readiness: 3    Maternal data: G2P1103  C-Section, Low Transverse  Current breast feeding challenges:: Mom is no longer experiencing pain during pumping  Pumping frequency: q3h Pumped volume: 240 mL Flange Size: 27  Pump: Personal (Hands Free (purchased a wearable pump on 7/13 for return to work))  Assessment Infant: LATCH Score: 5  Feeding Status: -- (Scheduled feedings)   Maternal: Milk volume: Normal Volume normal for multiples  Intervention/Plan Interventions: Breast feeding basics reviewed; Education; Position options; Infant Driven Feeding Algorithm education  Tools: Pump; Hands-free pumping top; Flanges (size XL) Pump Education: Setup, frequency, and cleaning; Milk Storage  Plan: Consult Status: NICU follow-up  NICU Follow-up type: Assist with IDF-1 (Mother to pre-pump before breastfeeding); Weekly NICU follow up  Continue lick and learn bf'ing  Elder Negus 08/06/2021, 2:22 PM

## 2021-08-27 ENCOUNTER — Ambulatory Visit: Payer: Self-pay

## 2021-08-27 NOTE — Lactation Note (Signed)
This note was copied from a baby's chart.  NICU Lactation Consultation Note  Patient Name: Elizabeth Terrell Date: 08/27/2021 Age:33 m.o.   Subjective Reason for consult: Follow-up assessment; Mother's request; NICU baby; Preterm <34wks; Multiple gestation  Lactation assisted with placement and positioning of baby A in football hold to the left breast. She was cueing at this attempt. She refused the breast by pushing off and clamping her mouth shut. We placed a size 20 NS and dotted EBM onto it to see if it would be more readily accepted. She refused the NS as well. We discontinued this attempt as baby was becoming frustrated. She wanted to rest in this position.  We discussed reasons for breast refusal; one likely factor is that babies have had thrush. I educated on how thrush affects latch behavior. I recommended that parent call her provider to get a topical prescription to prevent thrush as she was working on breastfeeding.  Parent continues to pump frequently. She expresses frustration that babies are not advancing as quickly as she hoped, and there are some social issues that she is currently encountering.  Parent requests lactation follow up on Wednesday 8/30 at 1400.  Objective Infant data: Mother's Current Feeding Choice: Breast Milk  Infant feeding assessment Scale for Readiness: 2 Scale for Quality: 3  Maternal data: [id number]  C-Section, Low Transverse Current breast feeding challenges:: NICU; separation  Previous breastfeeding challenges?: Infant separation; Breast / nipple pain  Does the patient have breastfeeding experience prior to this delivery?: Yes  Pumped volume: 240 mL   Pump: Personal (Hands Free (purchased a wearable pump on 7/13 for return to work))  Assessment Infant: LATCH Score: 6   Maternal: Milk volume: Normal   Intervention/Plan Interventions: Breast feeding basics reviewed; Assisted with latch; Skin to skin; Hand express;  Education; Adjust position; Support pillows  Tools: Nipple Shields Nipple shield size: 20  Plan: Consult Status: NICU follow-up  NICU Follow-up type: Weekly NICU follow up    Walker Shadow 08/27/2021, 3:34 PM

## 2021-08-27 NOTE — Lactation Note (Signed)
This note was copied from a baby's chart.  NICU Lactation Consultation Note  Patient Name: Elizabeth Terrell [id number] Date: 08/27/2021 Age:33 m.o.   Subjective Reason for consult: Follow-up assessment; Mother's request; NICU baby; Preterm <34wks; Multiple gestation  Lactation assisted with placement and positioning of Baby Boy B on the right breast. Parent prefers to feed him in an upright cradle hold. Baby was attempting to latch, but was not fully grasping the nipple. I observed NNS.   We placed a size 20 NS onto the nipple, and he did appear to latch to this breast better with the NS. He experienced tachycardia and desats while attempting to suckle. I recommended that we stop the feeding and allow baby to rest.  Baby shows strong interest in latching and suckling, but he is uncoordinated at this time with SSB. I recommended that Ms. Surles continue to offer opportunities for both babies to practice breastfeeding, being mindful of their "stop" signs.  Parent continues to pump frequently. She expresses frustration that babies are not advancing as quickly as she hoped, and there are some social issues that she is currently encountering.  Parent requests lactation follow up on Wednesday 8/30 at 1400.  Objective Infant data: Mother's Current Feeding Choice: Breast Milk  Infant feeding assessment Scale for Readiness: 2 (lick and learn with mom) Scale for Quality: 3  Maternal data: [id number]  Vaginal, Spontaneous Current breast feeding challenges:: NICU; separatoin   Pumped volume: 240 mL   WIC Program: Yes WIC Referral Sent?: Yes Pump: Personal, Hands Free (purchased a wearable pump on 7/13 for return to work)  Assessment Infant: LATCH Score: 6   Maternal: Milk volume: Normal   Intervention/Plan Interventions: Breast feeding basics reviewed; Assisted with latch; Skin to skin; Hand express; Education; Adjust position; Support pillows  Tools: Pump; Nipple Shields Pump  Education: Setup, frequency, and cleaning Nipple shield size: 20  Plan: Consult Status: NICU follow-up  NICU Follow-up type: Weekly NICU follow up    Walker Shadow 08/27/2021, 3:43 PM

## 2021-08-31 ENCOUNTER — Ambulatory Visit: Payer: Self-pay

## 2021-08-31 NOTE — Lactation Note (Signed)
This note was copied from a baby's chart.  NICU Lactation Consultation Note  Patient Name: Elizabeth Terrell [id number] Date: 08/31/2021 Age:33 m.o.   Subjective Reason for consult: Follow-up assessment; NICU baby; Multiple gestation  Lactation assisted with placement of Baby A on the left breast. Parent leaned back in chair, and we placed baby on her chest. As he began to root, I guided him down to the breast. I used a towel to support the breast. I placed a size 24 NS onto the breast (parent requests for better comfort), and we lowered him. He was belly to belly in biological nurturing position.  Baby latched with rhythmic suckling sequences. He was able to maintain his latch and resume suckling after pauses. We noted his heart rate was WNL at this time, but he did become tachypneic. Parent pre-pumped the breasts and there was no evidence of transfer aside from a trace amount of residue inside the NS.   Due to baby's tendency to become tachypneic, it is advisable that parent continue to pre-pump the breasts with each feeding attempt.  Socially, parent is working through some challenges, and I was able to listen to and validate her feelings in this consult.   Objective Infant data: Mother's Current Feeding Choice: Breast Milk  Infant feeding assessment Scale for Readiness: 2 Scale for Quality: 5 (bf attempt stopped due to tachypnea)  Maternal data: [id number]  Vaginal, Spontaneous  Current breast feeding challenges:: NICU; social challenges (external to NICU)   WIC Program: Yes [facility] Referral Sent?: Yes Pump: Personal, Hands Free (purchased a wearable pump on 7/13 for return to work)  Assessment Infant: LATCH Score: 6  Intervention/Plan Interventions: Breast feeding basics reviewed; Assisted with latch; Skin to skin; Adjust position; Support pillows; Education  Tools: Nipple Shields Nipple shield size: 24; Other (comment) (increased to a 24 per parent  request)  Plan: Consult Status: NICU follow-up  NICU Follow-up type: Weekly NICU follow up    Walker Shadow 08/31/2021, 4:06 PM

## 2021-08-31 NOTE — Lactation Note (Addendum)
This note was copied from a baby's chart.  NICU Lactation Consultation Note  Patient Name: Elizabeth Terrell [id number] Date: 08/31/2021 Age:33 m.o.   Subjective Reason for consult: Follow-up assessment; NICU baby; Preterm <34wks; Multiple gestation  Lactation followed up with birth parent to assist with feedings. Upon entry, she was holding Baby B and states that she did not latch. Parent expressed feelings of frustration that baby was not latching. We obtained another pillow and placed baby in football hold on the left side. Baby settled down and relaxed.   Feeding not observed at this time.   Objective Infant data: Mother's Current Feeding Choice: Breast Milk  Infant feeding assessment Scale for Readiness: 2 Scale for Quality: 5 (frustrated throughout bf attempt)  Maternal data: [id number]  C-Section, Low Transverse   Pump: Personal (Hands Free (purchased a wearable pump on 7/13 for return to work))  Assessment Infant: LATCH Score: 4   Intervention/Plan Interventions: Breast feeding basics reviewed; Education  Plan: Consult Status: NICU follow-up  NICU Follow-up type: Weekly NICU follow up    Walker Shadow 08/31/2021, 4:01 PM

## 2021-09-17 ENCOUNTER — Ambulatory Visit: Payer: Self-pay

## 2021-09-17 NOTE — Lactation Note (Signed)
This note was copied from a baby's chart.  NICU Lactation Consultation Note  Patient Name: Elizabeth Terrell [id number] Date: 09/17/2021 Age:31 years   Subjective Reason for consult: Follow-up assessment; Nipple pain/trauma Jini complains of bilateral nipple pain during and after pumping. On observation, nipple/areola tissue has appearance of yeast growth. We discussed sterilization / cleaning needs and I encouraged her to f/u with her OB for diagnosis and treatment. She will likely need to be treated topically and orally given the progression of yeast growth visualized and her description of pain. Of note, [name] is living in a motel without access to washing machine and stove/microwave to sterilize pump pieces. RN provided sterilization bag for use at hospital.   Objective Infant data: Mother's Current Feeding Choice: Breast Milk  Infant feeding assessment Scale for Readiness: 2 Scale for Quality: 3    Maternal data: [id number]  Vaginal, Spontaneous Current breast feeding challenges:: breast pain & s/s of bilateral yeast infection  WIC Program: Yes WIC Referral Sent?: Yes Pump: Personal, Hands Free (purchased a wearable pump on 7/13 for return to work)  Assessment Maternal:  Intervention/Plan Interventions: Education (yeast treatment ed/ referred to [facility])  Plan: Consult Status: NICU follow-up  NICU Follow-up type: Weekly NICU follow up  F/u with OB/MAU for diagnosis and treatment  [name] 09/17/2021, 5:55 PM

## 2021-09-18 ENCOUNTER — Ambulatory Visit: Payer: Self-pay

## 2021-09-18 NOTE — Lactation Note (Signed)
This note was copied from a baby's chart. Lactation Consultation Note I followed up with [name] today about her s/s of yeast on nipples. She has not yet contacted her MD or started any treatment. I encouraged her to f/u with her MD.   Patient Name: Elizabeth Terrell [id number] Date: 09/18/2021   Age:34 m.o.      [name] 09/18/2021, 12:22 PM

## 2021-09-21 ENCOUNTER — Ambulatory Visit: Payer: Self-pay

## 2021-09-21 NOTE — Lactation Note (Signed)
This note was copied from a baby's chart.  NICU Lactation Consultation Note  Patient Name: Elizabeth Terrell [id number] Date: 09/21/2021 Age:33 m.o.  Subjective Reason for consult: Follow-up assessment; NICU baby; Multiple gestation; Term; Other (Comment) (Telephone call)  LC in the room to visit with Ms. Willow but she hasn't come to the hospital today. 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 She was told to come back to her provider's office if her symptoms don't improve in 2-3 days, they'll be treating her for yeast if dicloxacillin doesn't work out. She also voiced that because of her job schedule it's been challenging to find the time to pump, she's been consistently going 6 hours without pumping during the day and 9 hours at night. She said it's been challenging to sanitize her pump parts because she doesn't have a microwave or a stove. Let her know that she can use the Dr. Saul Fordyce bags to sanitize her pump parts when at the hospital. She also clarified that she has not been taking babies to breast while having the "yeast appearance" on her breasts, she denied any S/S at the time she was taking them to breast, she said she was fine. NICU LC to F/U with Ms. Brassfield the next time she comes to see babies in the unit, asked her to request her RN for NICU LC. All questions and concerns answered, family to contact [facility] services PRN.  Objective Infant data: Mother's Current Feeding Choice: Breast Milk  Infant feeding assessment Scale for Readiness: 1 Scale for Quality: 3  Maternal data: [id number]  C-Section, Low Transverse Pump: Personal (Hands Free (purchased a wearable pump on 7/13 for return to  work))  Assessment Infant: In NICU  Intervention/Plan Interventions: Education  Plan: Consult Status: NICU follow-up  NICU Follow-up type: Weekly NICU follow up   [employer] 09/21/2021, 5:03 PM

## 2022-02-21 ENCOUNTER — Other Ambulatory Visit: Payer: Self-pay

## 2022-08-03 ENCOUNTER — Other Ambulatory Visit (HOSPITAL_COMMUNITY): Payer: Self-pay

## 2023-01-28 IMAGING — US US MFM OB LIMITED
1 series · 14 of 28 positions shown · non-contrast
Comparison: none

[Series 1: us mfm ob limited · 14 of 34 slices shown]
[im 2/34]
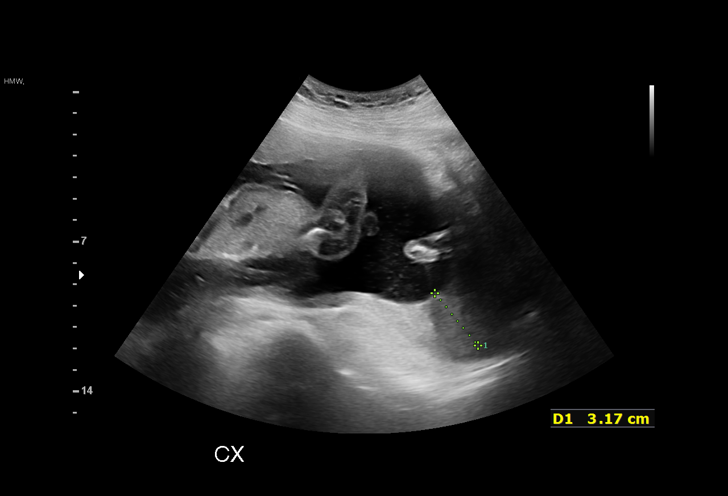
[im 4/34]
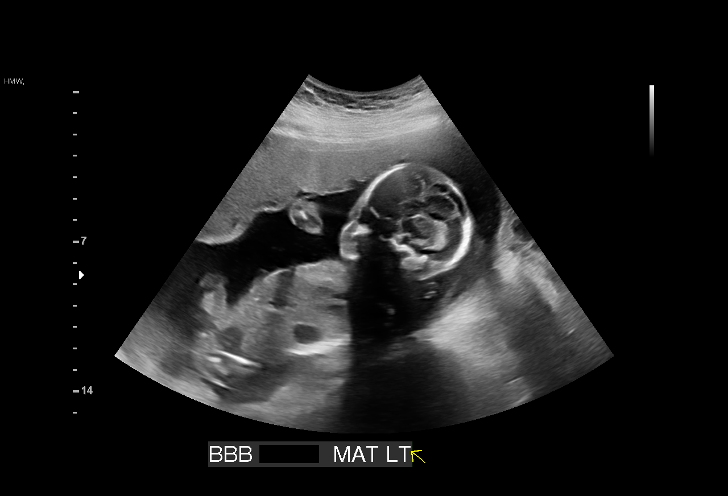
[im 7/34]
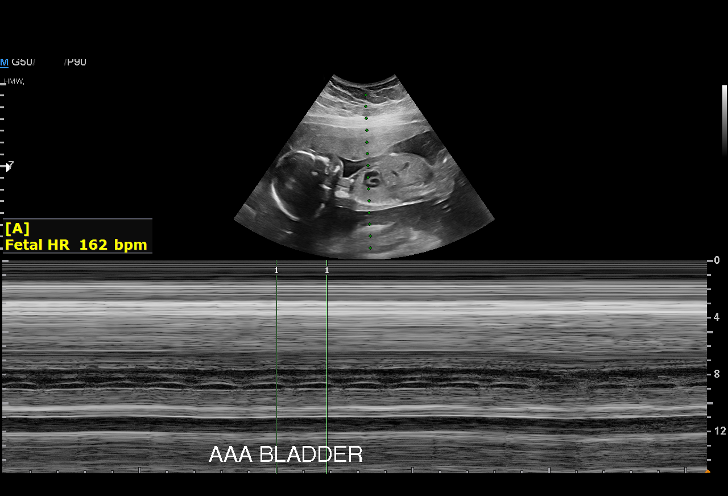
[im 9/34]
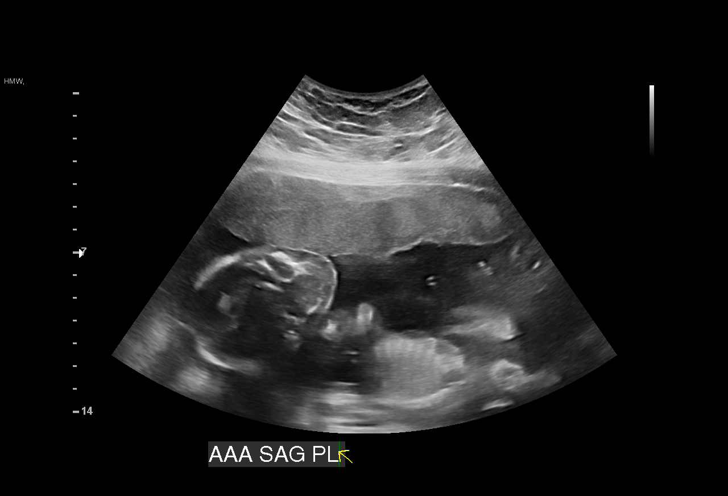
[im 12/34]
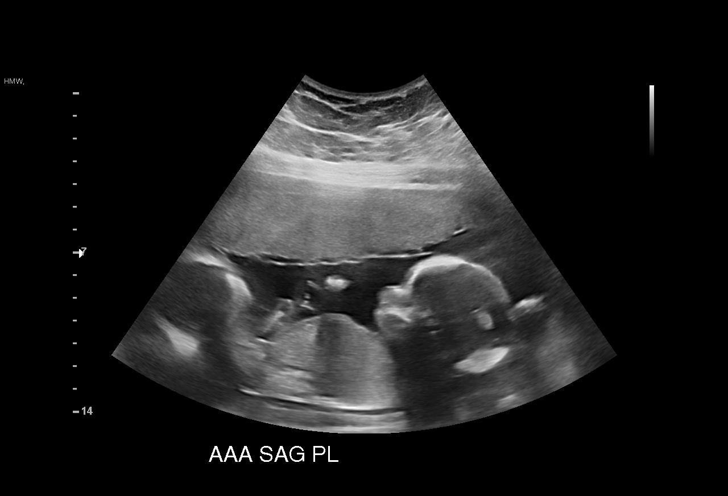
[im 14/34]
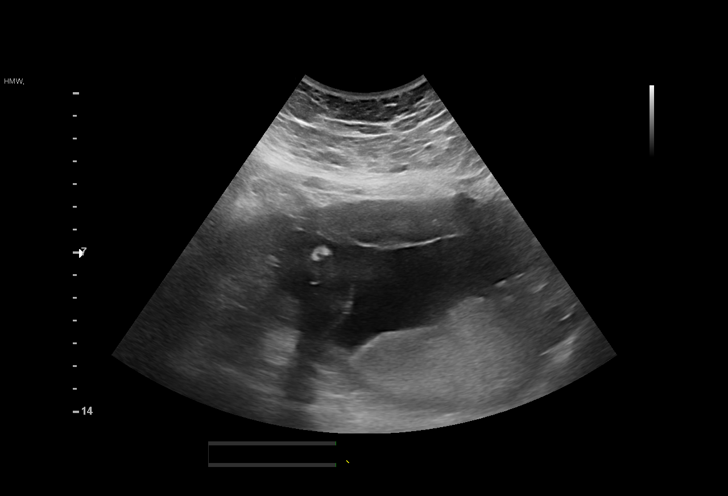
[im 16/34]
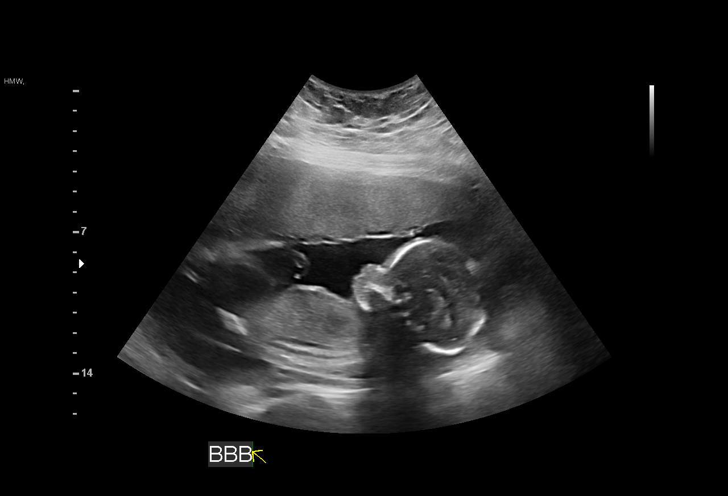
[im 19/34]
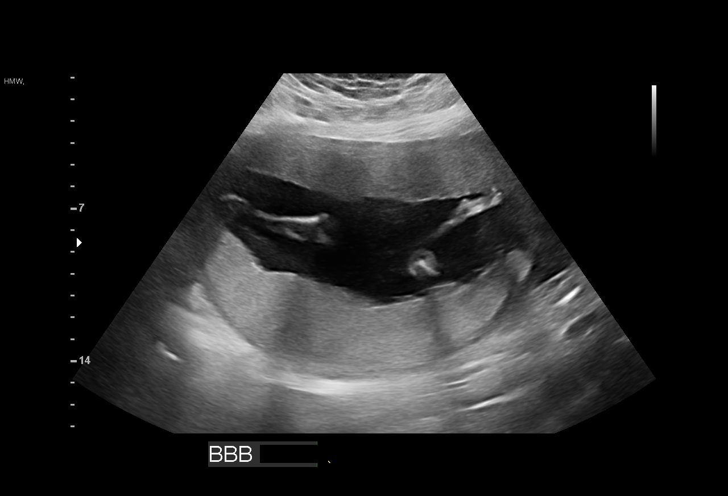
[im 21/34]
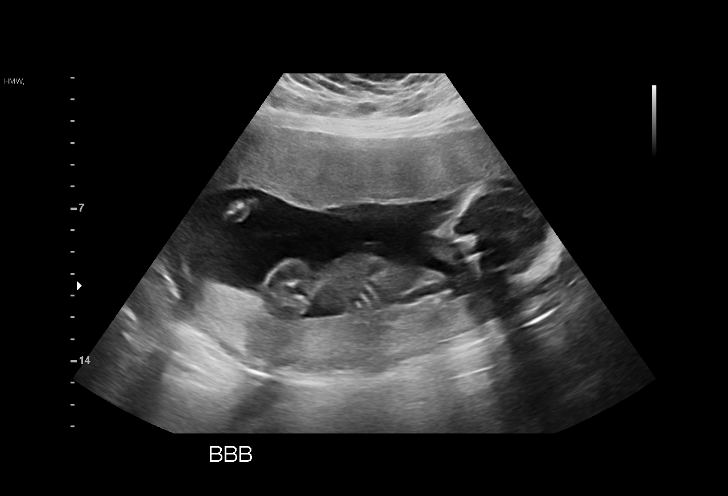
[im 24/34]
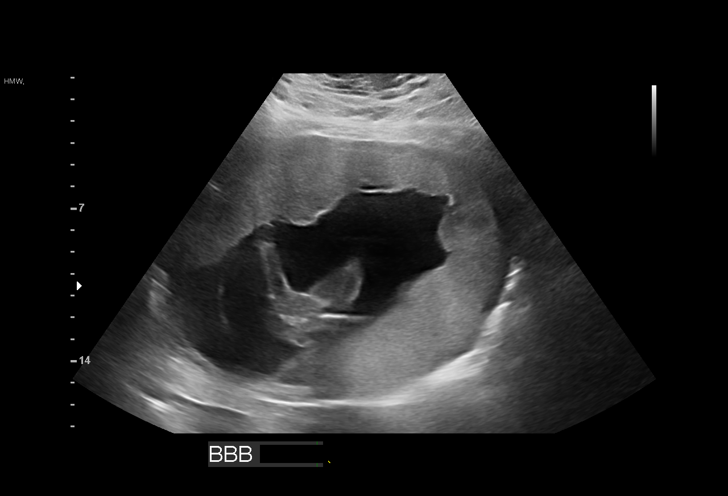
[im 26/34]
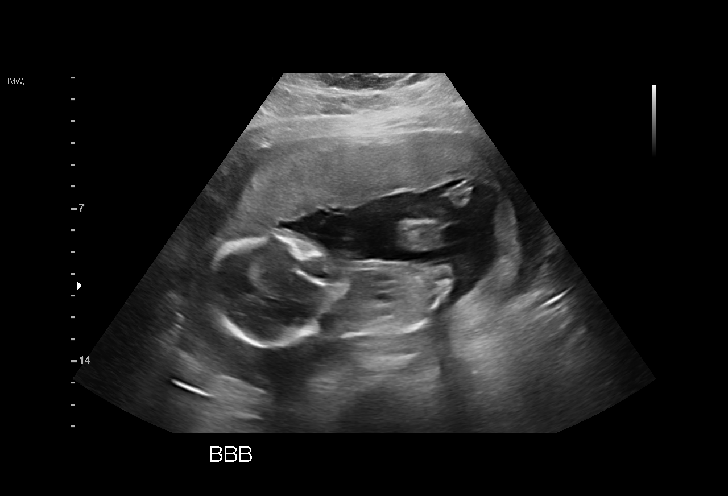
[im 29/34]
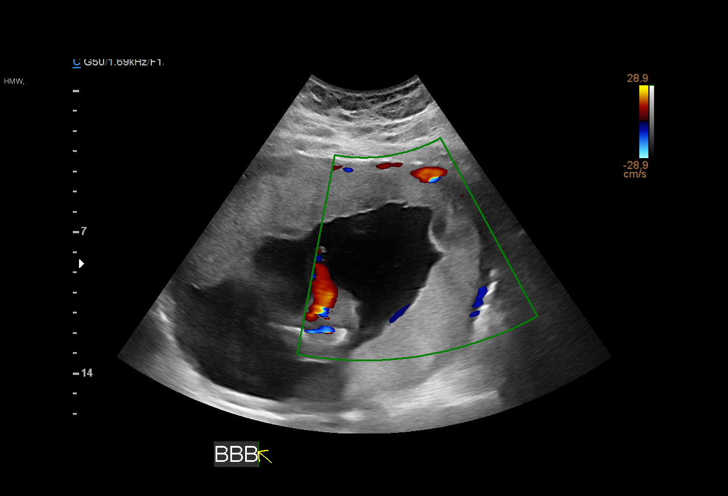
[im 31/34]
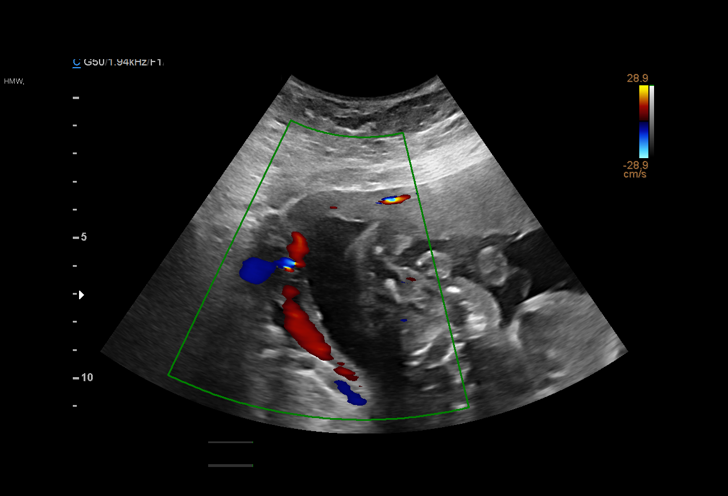
[im 34/34]
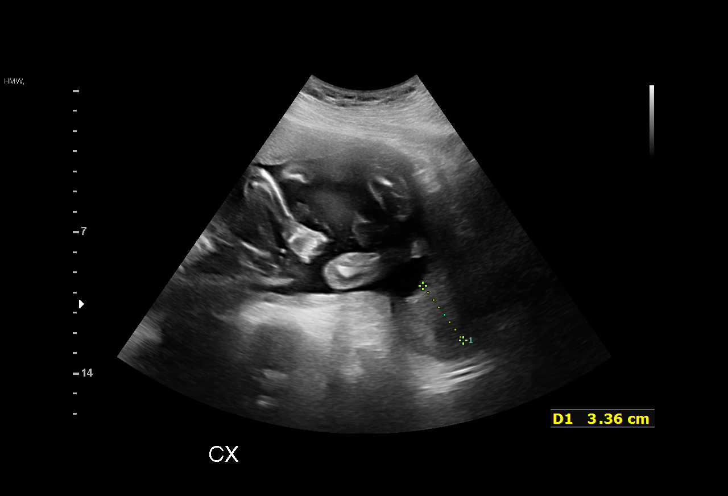

[14 of 28 positions shown; findings below may reference images not displayed]

1  US MFM OB LIMITED                     76815.01    ARALIYA MEDO

Indications

 20 weeks gestation of pregnancy
 Leakage of amniotic fluid
 Encounter for antenatal screening,
 unspecified
Fetal Evaluation (Fetus A)

 Num Of Fetuses:          2
 Fetal Heart Rate(bpm):   162
 Cardiac Activity:        Observed
 Fetal Lie:               Maternal right side
 Presentation:            Breech
 Placenta:                Anterior
 P. Cord Insertion:       Not well visualized
 Membrane Desc:      Dividing Membrane seen

 Amniotic Fluid
 AFI FV:      Within normal limits

                             Largest Pocket(cm)

OB History

 Gravidity:    2         Term:   1        Prem:   0        SAB:   0
 TOP:          0       Ectopic:  0        Living: 1
Gestational Age (Fetus A)

 LMP:           20w 2d        Date:  12/03/20                  EDD:   09/09/21
 Best:          20w 2d     Det. By:  LMP  (12/03/20)          EDD:   09/09/21
Anatomy (Fetus A)
 Thoracic:              Appears normal         Bladder:                Appears normal
 Stomach:               Appears normal, left
                        sided

Fetal Evaluation (Fetus B)

 Num Of Fetuses:          2
 Fetal Heart Rate(bpm):   164
 Cardiac Activity:        Observed
 Fetal Lie:               Maternal left side
 Presentation:            Cephalic
 Placenta:                Posterior
 P. Cord Insertion:       Not well visualized
 Membrane Desc:      Dividing Membrane seen

 Amniotic Fluid
 AFI FV:      Within normal limits

                             Largest Pocket(cm)

Gestational Age (Fetus B)

 LMP:           20w 2d        Date:  12/03/20                  EDD:   09/09/21
 Best:          20w 2d     Det. By:  LMP  (12/03/20)          EDD:   09/09/21
Anatomy (Fetus B)

 Thoracic:              Appears normal         Bladder:                Appears normal
 Stomach:               Appears normal, left
                        sided
Cervix Uterus Adnexa

 Cervix
 Length:            3.4  cm.
 Closed

 Uterus
 No abnormality visualized.

 Right Ovary
 No adnexal mass visualized.

 Left Ovary
 No adnexal mass visualized.

 Cul De Sac
 No free fluid seen.

 Adnexa
 No abnormality visualized.
Impression
 Limited exam to assess diamniotic dichorionic twin amniotic
 fluid

 Normal amniotic fluid for twin A (Breech) and B (Cephalic).
 Cervical length is 3.4 cm without evidence of funneling.
Recommendations

 Clinical correlation recommended.

## 2023-03-14 IMAGING — US US MFM OB LIMITED
1 series · 15 of 28 positions shown · non-contrast
Comparison: none

[Series 1: us mfm ob limited · 15 of 71 slices shown]
[im 1/71]
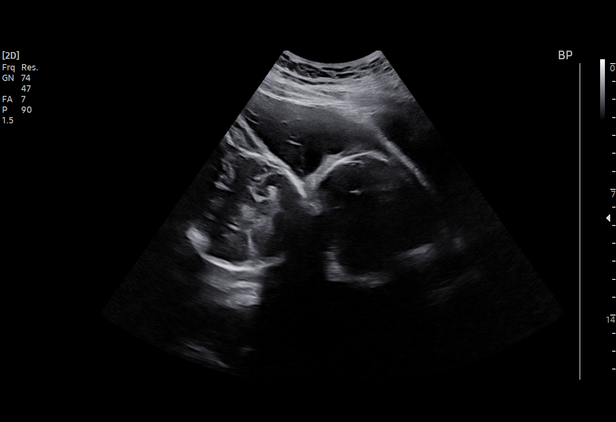
[im 6/71]
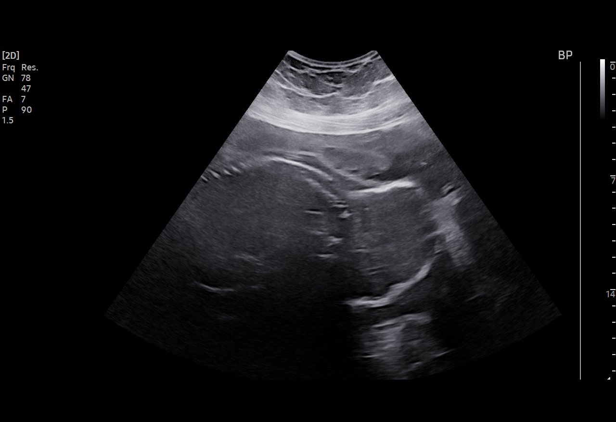
[im 11/71]
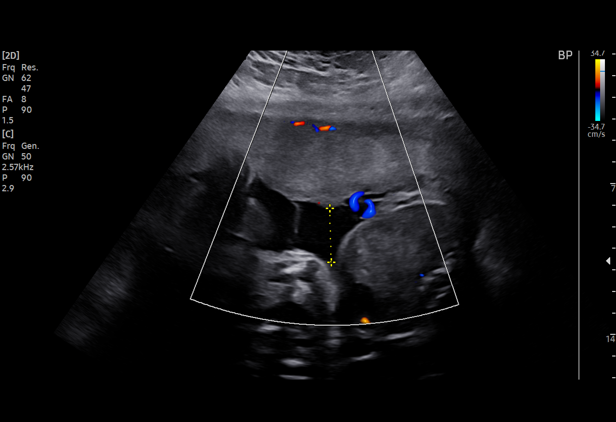
[im 16/71]
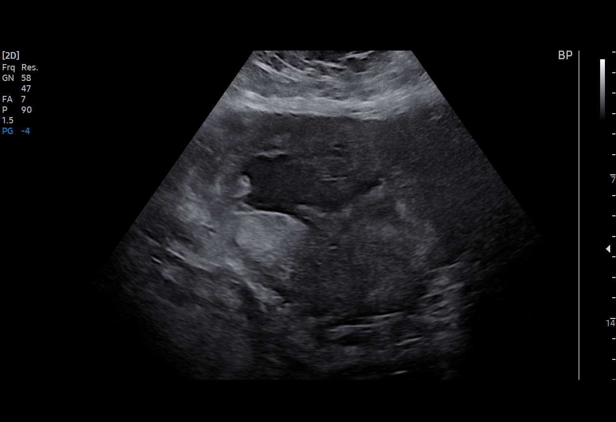
[im 21/71]
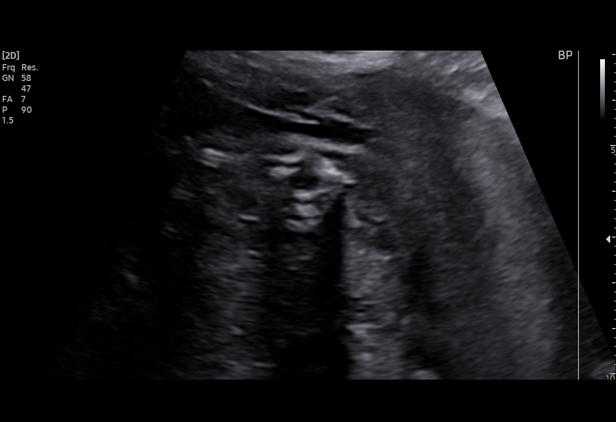
[im 26/71]
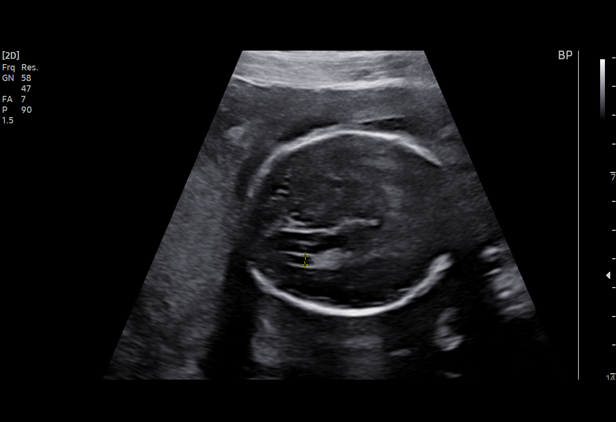
[im 32/71]
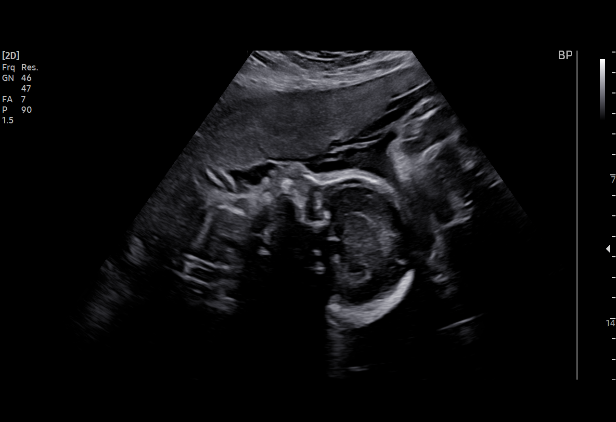
[im 37/71]
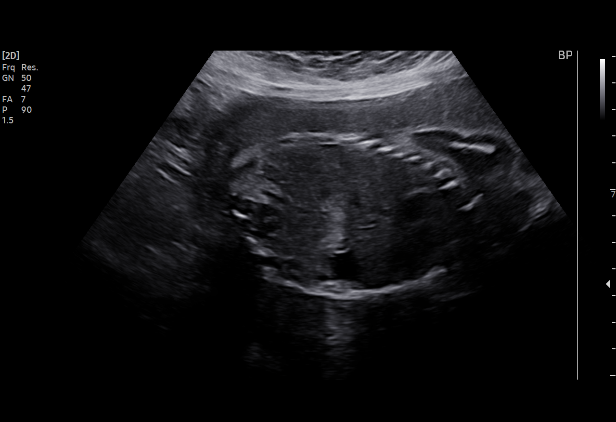
[im 39/71]
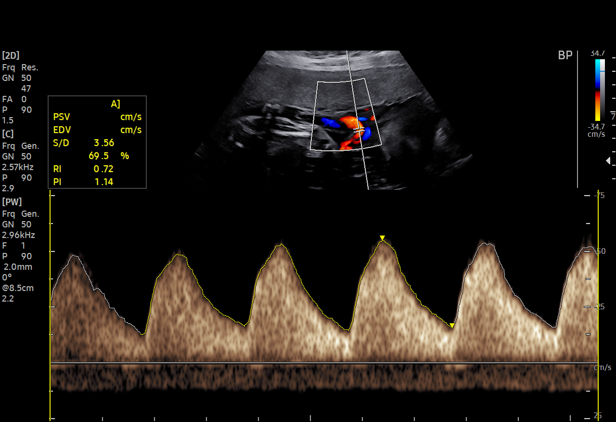
[im 45/71]
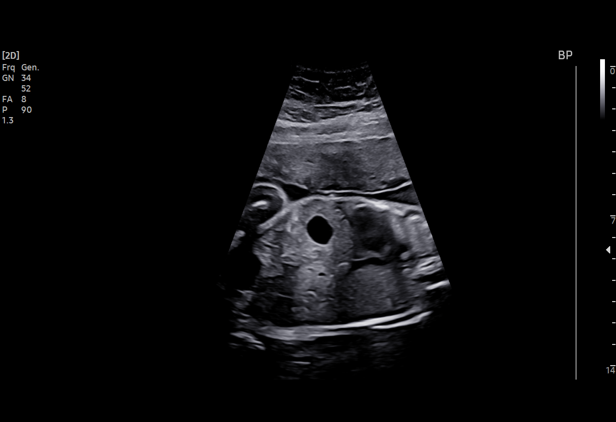
[im 50/71]
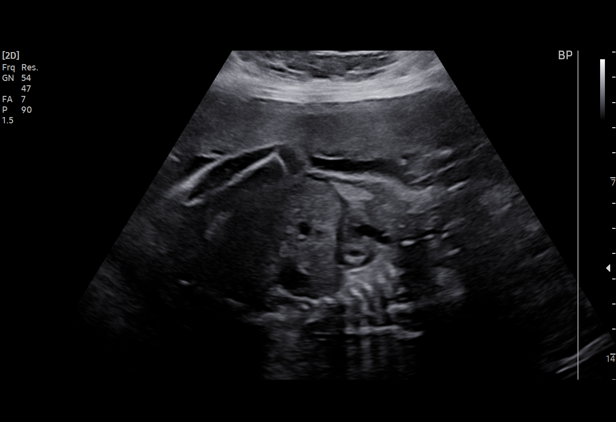
[im 55/71]
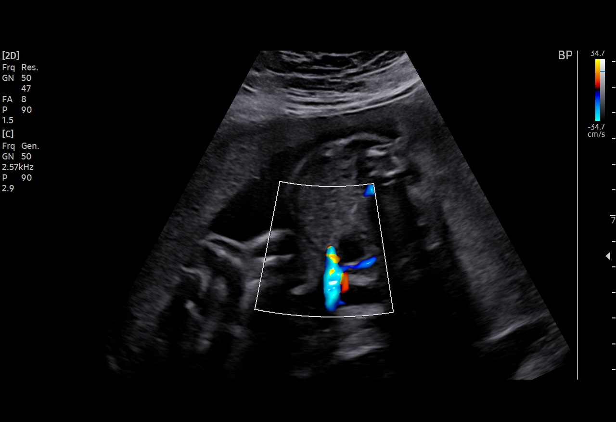
[im 60/71]
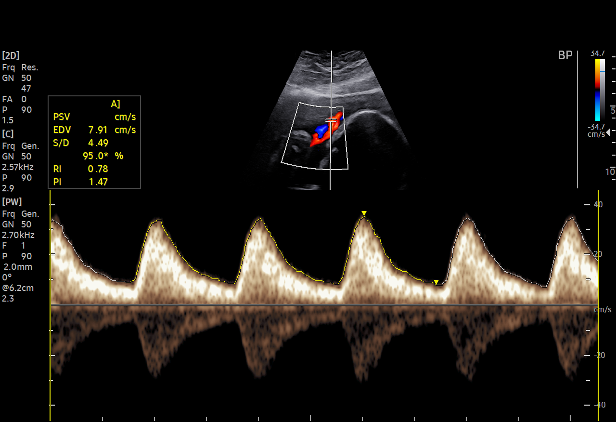
[im 65/71]
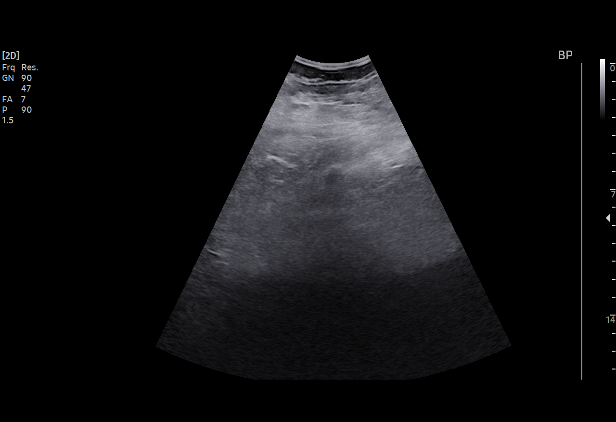
[im 71/71]
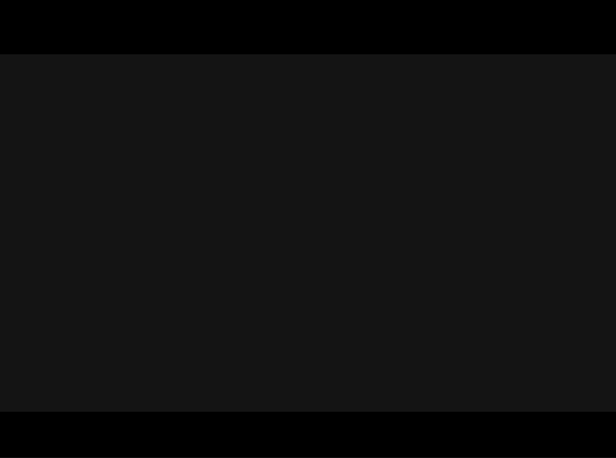

[15 of 28 positions shown; findings below may reference images not displayed]

Indications

 Twin pregnancy, di/di, second trimester
 Maternal care for known or suspected poor
 fetal growth, second trimester, fetus 1 IUGR
 Maternal care for known or suspected poor
 fetal growth, second trimester, fetus 2 IUGR
 Hypertension - Chronic/Pre-existing
 (Procardia)
 Obesity complicating pregnancy, second
 trimester (BMI 58)
 Premature rupture of membranes - leaking
 fluid (positive Amnisure)
 Cervical insufficiency, 2nd (vaginal
 progesterone)
 Thrombocytopenia affecting pregnancy,          O99.119,
 antepartum
 26 weeks gestation of pregnancy
Vital Signs

 BP:          128/90
Fetal Evaluation (Fetus A)
 Num Of Fetuses:         2
 Fetal Heart Rate(bpm):  150
 Cardiac Activity:       Observed
 Fetal Lie:              Maternal right side
 Presentation:           Cephalic
 Placenta:               Anterior
 P. Cord Insertion:      Visualized
 Membrane Desc:      Dividing Membrane seen

 Amniotic Fluid
 AFI FV:      Within normal limits

                             Largest Pocket(cm)

Biometry (Fetus A)

 LV:        2.9  mm
OB History

 Gravidity:    2         Term:   1        Prem:   0        SAB:   0
 TOP:          0       Ectopic:  0        Living: 1
Gestational Age (Fetus A)

 LMP:           26w 5d        Date:  12/03/20                  EDD:   09/09/21
 Best:          26w 5d     Det. By:  LMP  (12/03/20)          EDD:   09/09/21
Anatomy (Fetus A)

 Cranium:               Appears normal         Aortic Arch:            Previously seen
 Cavum:                 Appears normal         Ductal Arch:            Previously seen
 Ventricles:            Appears normal         Diaphragm:              Appears normal
 Choroid Plexus:        Previously seen        Stomach:                Appears normal, left
                                                                       sided
 Cerebellum:            Previously seen        Abdomen:                Appears normal
 Posterior Fossa:       Previously seen        Abdominal Wall:         Previously seen
 Nuchal Fold:           Not applicable (>20    Cord Vessels:           Appears normal (3
                        wks GA)                                        vessel cord)
 Face:                  Appears normal         Kidneys:                Appear normal
                        (orbits and profile)
 Lips:                  Not well visualized    Bladder:                Appears normal
 Thoracic:              Appears normal         Spine:                  Previously seen
 Heart:                 Appears normal         Upper Extremities:      Previously seen
                        (4CH, axis, and
                        situs)
 RVOT:                  Appears normal         Lower Extremities:      Previously seen
 LVOT:                  Appears normal

 Other:  VC, 3VV, 3VTV, feet previously visualized. Hands not well visualized.
         Male gender previously seen.
Doppler - Fetal Vessels (Fetus A)

 Umbilical Artery
  S/D     %tile      RI    %tile      PI    %tile     PSV    ADFV    RDFV
                                                    (cm/s)
  3.55       69    0.72       74    1.[REDACTED]      No      No
Fetal Evaluation (Fetus B)

 Num Of Fetuses:         2
 Fetal Heart Rate(bpm):  159
 Cardiac Activity:       Observed
 Fetal Lie:              Maternal left side
 Presentation:           Cephalic
 Placenta:               Posterior
 P. Cord Insertion:      Previously Visualized
 Membrane Desc:      Dividing Membrane seen

 Amniotic Fluid
 AFI FV:      Within normal limits

                             Largest Pocket(cm)

Biometry (Fetus B)

 LV:        4.2  mm
Gestational Age (Fetus B)

 LMP:           26w 5d        Date:  12/03/20                  EDD:   09/09/21
 Best:          26w 5d     Det. By:  LMP  (12/03/20)          EDD:   09/09/21
Anatomy (Fetus B)

 Cranium:               Appears normal         Aortic Arch:            Previously seen
 Cavum:                 Appears normal         Ductal Arch:            Previously seen
 Ventricles:            Appears normal         Diaphragm:              Appears normal
 Choroid Plexus:        Previously seen        Stomach:                Appears normal, left
                                                                       sided
 Cerebellum:            Previously seen        Abdomen:                Appears normal
 Posterior Fossa:       Previously seen        Abdominal Wall:         Previously seen
 Nuchal Fold:           Not applicable (>20    Cord Vessels:           Appears normal (3
                        wks GA)                                        vessel cord)
 Face:                  Orbits and profile     Kidneys:                Appear normal
                        previously seen
 Lips:                  Not well visualized    Bladder:                Appears normal
 Thoracic:              Appears normal         Spine:                  Previously seen
 Heart:                 Appears normal         Upper Extremities:      Previously seen
                        (4CH, axis, and
                        situs)
 RVOT:                  Previously seen        Lower Extremities:      Previously seen
 LVOT:                  Previously seen

 Other:  Hands and feet previously visualized. Female gender previously seen.
Doppler - Fetal Vessels (Fetus B)

 Umbilical Artery
  S/D     %tile      RI    %tile      PI    %tile     PSV    ADFV    RDFV
                                                    (cm/s)
  4.49       94    0.78       92    1.47   > 97.5    35.49      No      No
Cervix Uterus Adnexa

 Cervix
 Not visualized (advanced GA >91wks)

 Uterus
 Normal shape and size.

 Right Ovary
 Not visualized.

 Left Ovary
 Not visualized.
Impression

 Dichorionic-diamniotic twin pregnancy with fetal growth
 restrictions in both twins.  On ultrasound performed on
 05/19/2021, estimated fetal weight of twin A was at the 8th
 percentile and that of twin B was at the 3rd percentile (severe
 fetal growth restriction).
 Patient had suspected preterm premature rupture of
 membranes but subsequent ultrasound showed normal
 amniotic fluid and her symptoms had resolved.
 She was transferred to [facility] on 05/25/2021
 after evaluation at the [facility] when cervix was found to be 4
 cm dilated.  The NICU census was full at [HOSPITAL],
 [HOSPITAL].
 She had ultrsasound at [facility] that confirmed fetal
 growth restrictions and was later discharged from [facility].  Patient had received Eugenio on
 [DATE] and 05/19/2021.
 She does not have uterine contractions.

 Twin A: Maternal right, cephalic presentation, anterior
 placenta.  Amniotic fluid is normal and good fetal activity
 seen.  Umbilical artery Doppler showed normal forward
 diastolic flow.
 Twin B: Maternal left, cephalic presentation, posterior
 placenta.  Amniotic fluid is normal and good fetal activity
 seen.  Umbilical artery Doppler showed normal forward
 diastolic flow.
 I reassured the patient of the findings.  I discussed inpatient
 management as cervical dilation in twin pregnancy can lead
 to preterm delivery.  Patient does not want to be admitted and
 prefers outpatient management.
Recommendations

 -Fetal growth assessment and UA Doppler next week.
                Ceejay, Paulus N
